# Patient Record
Sex: Female | Born: 1955 | Race: White | Hispanic: No | Marital: Married
Health system: Midwestern US, Academic
[De-identification: ages and names within clinical notes are randomized; demographics above are authoritative.]

---

## 2017-08-08 IMAGING — MG MAMMOGRAM 3D SCREEN, BILATERAL
14 of 16 series · 14 of 16 positions shown · non-contrast
Comparison: none

[R CC (1 of 2)]
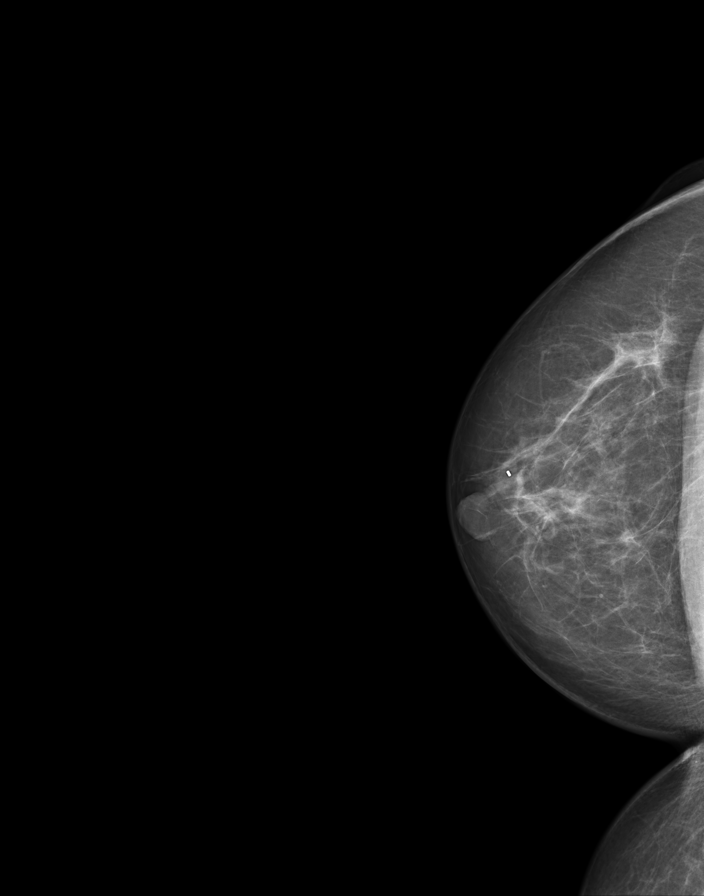

[R tomo (1 of 2)]
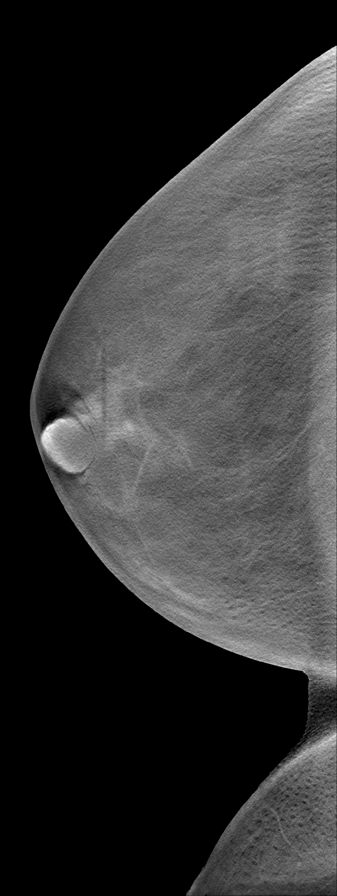

[R CC (2 of 2)]
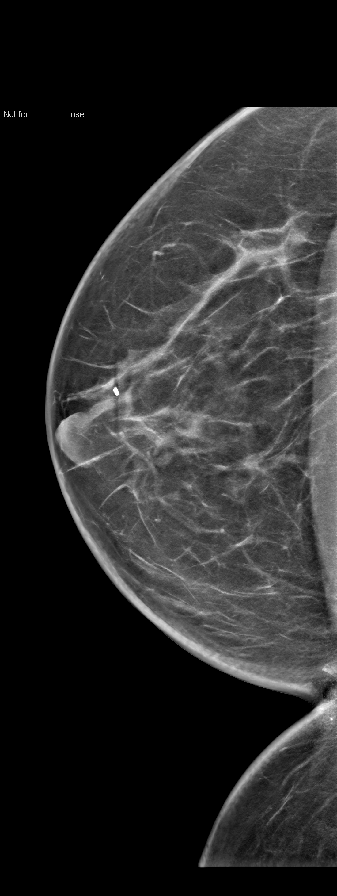

[R (1 of 2)]
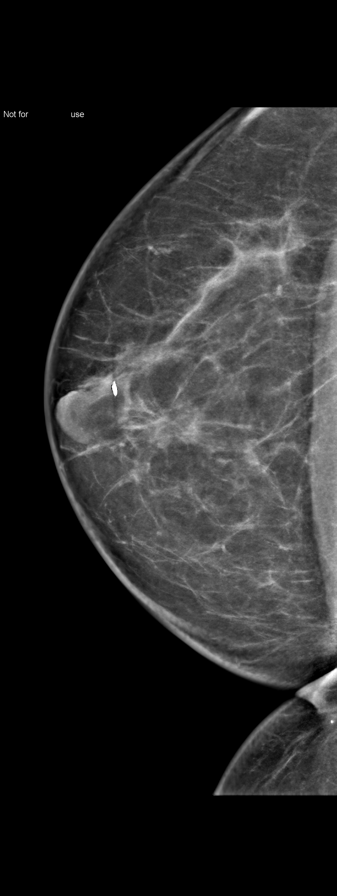

[L CC (1 of 2)]
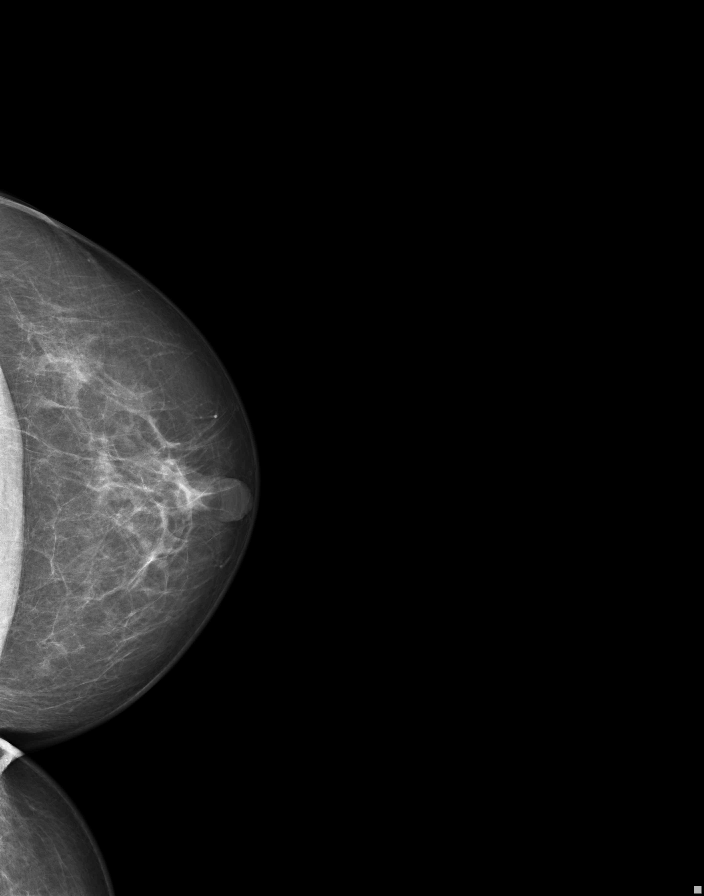

[L tomo (1 of 2)]
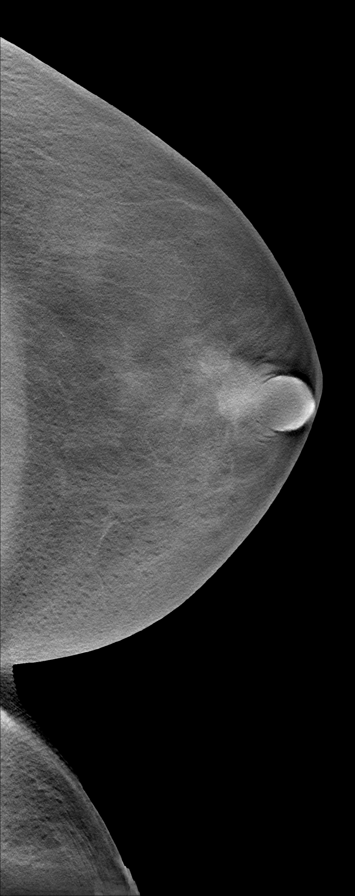

[L CC (2 of 2)]
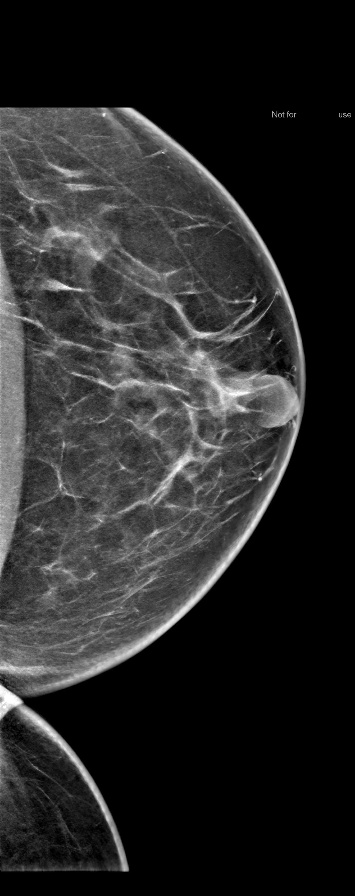

[L]
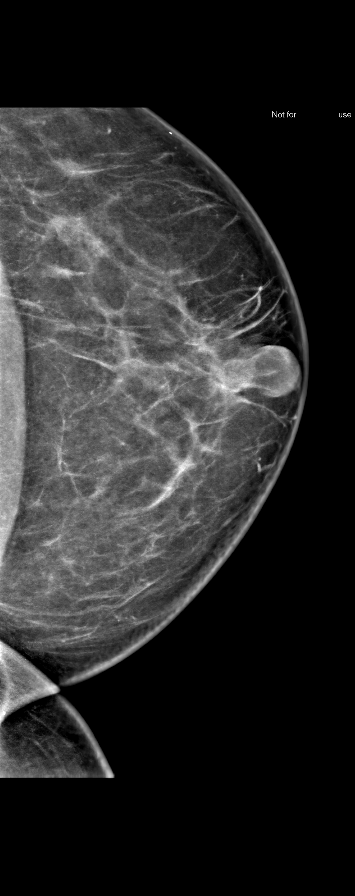

[R MLO (1 of 2)]
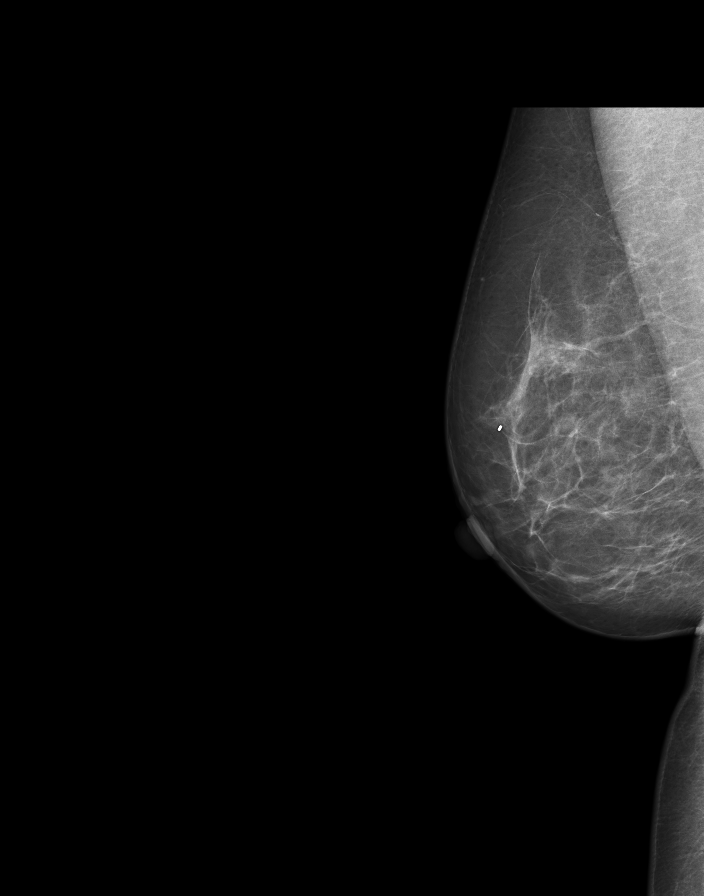

[R tomo (2 of 2)]
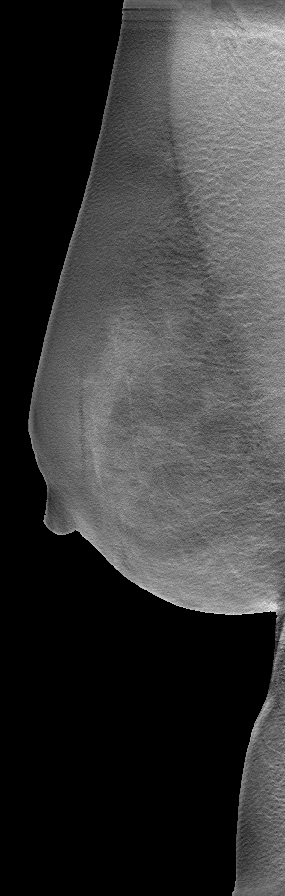

[R MLO (2 of 2)]
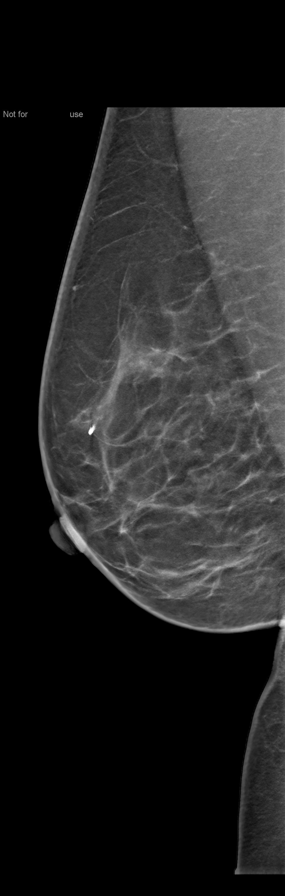

[R (2 of 2)]
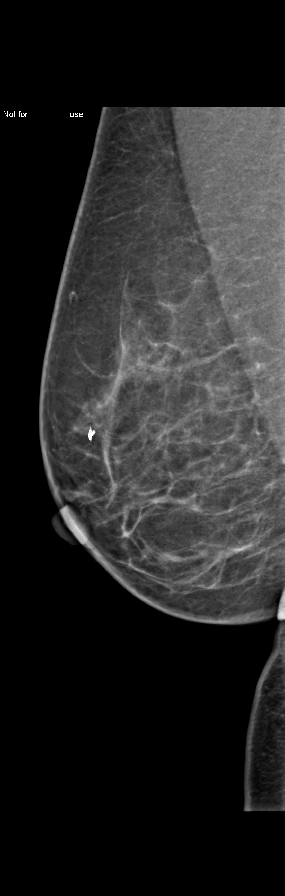

[L MLO]
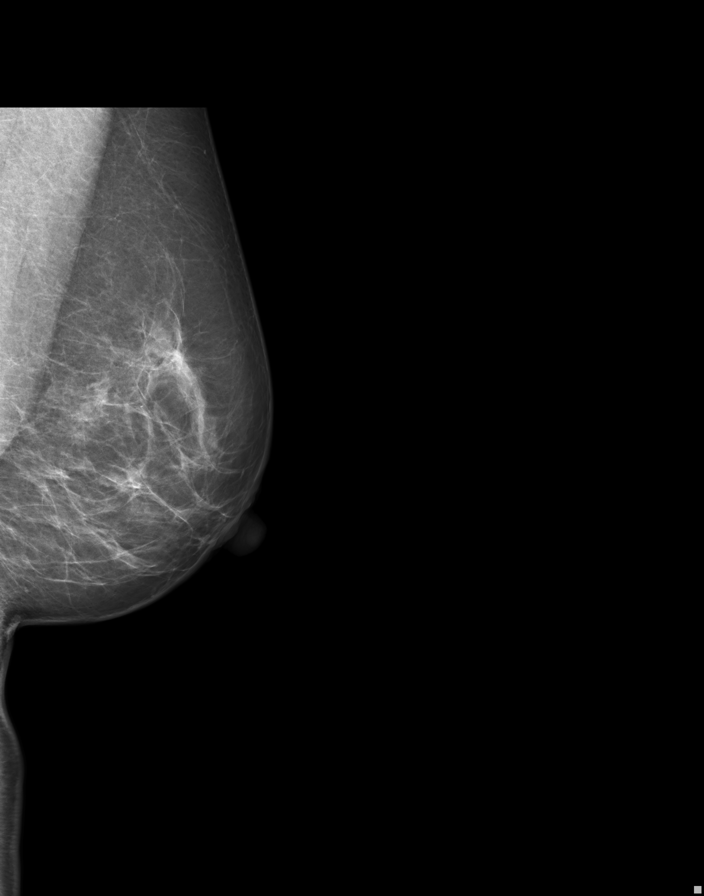

[L tomo (2 of 2)]
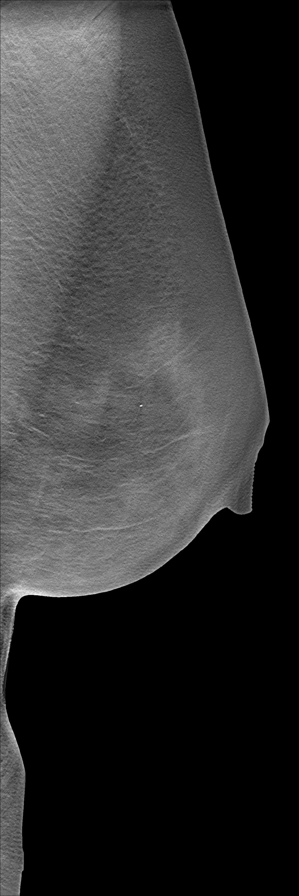

[14 of 16 positions shown; findings below may reference images not displayed]

EXAM

Digital screening mammogram with 3D breast tomography.

INDICATION

yearly screening
HRT SINCE 6453. BENIGN SURGICAL BX - RT AXILLA, 2414. SCREENING. AB
(3D) PRIORS: 2567, 4052.

FINDINGS

The prior study was reviewed from 03/21/2016.

Digital 2D CC and MLO projections obtained with 3D tomographic views per manufacturer's protocol.
ICAD version 7.2 was used during this exam.

There are scattered areas of fibroglandular density bilaterally. There are no mass lesions or
suspicious calcifications.

There is a marker clip from a previous breast biopsy on the right.

There has been no change from the previous examination.

IMPRESSION

Benign mammogram with 3D breast tomography. BI-RADS 2. Screening mammography in 12 months is
recommended. A followup letter will be scheduled.

## 2019-02-07 IMAGING — CR UP_EXM
4 series · 4 of 4 positions shown · non-contrast
Comparison: none

[elbow ap]
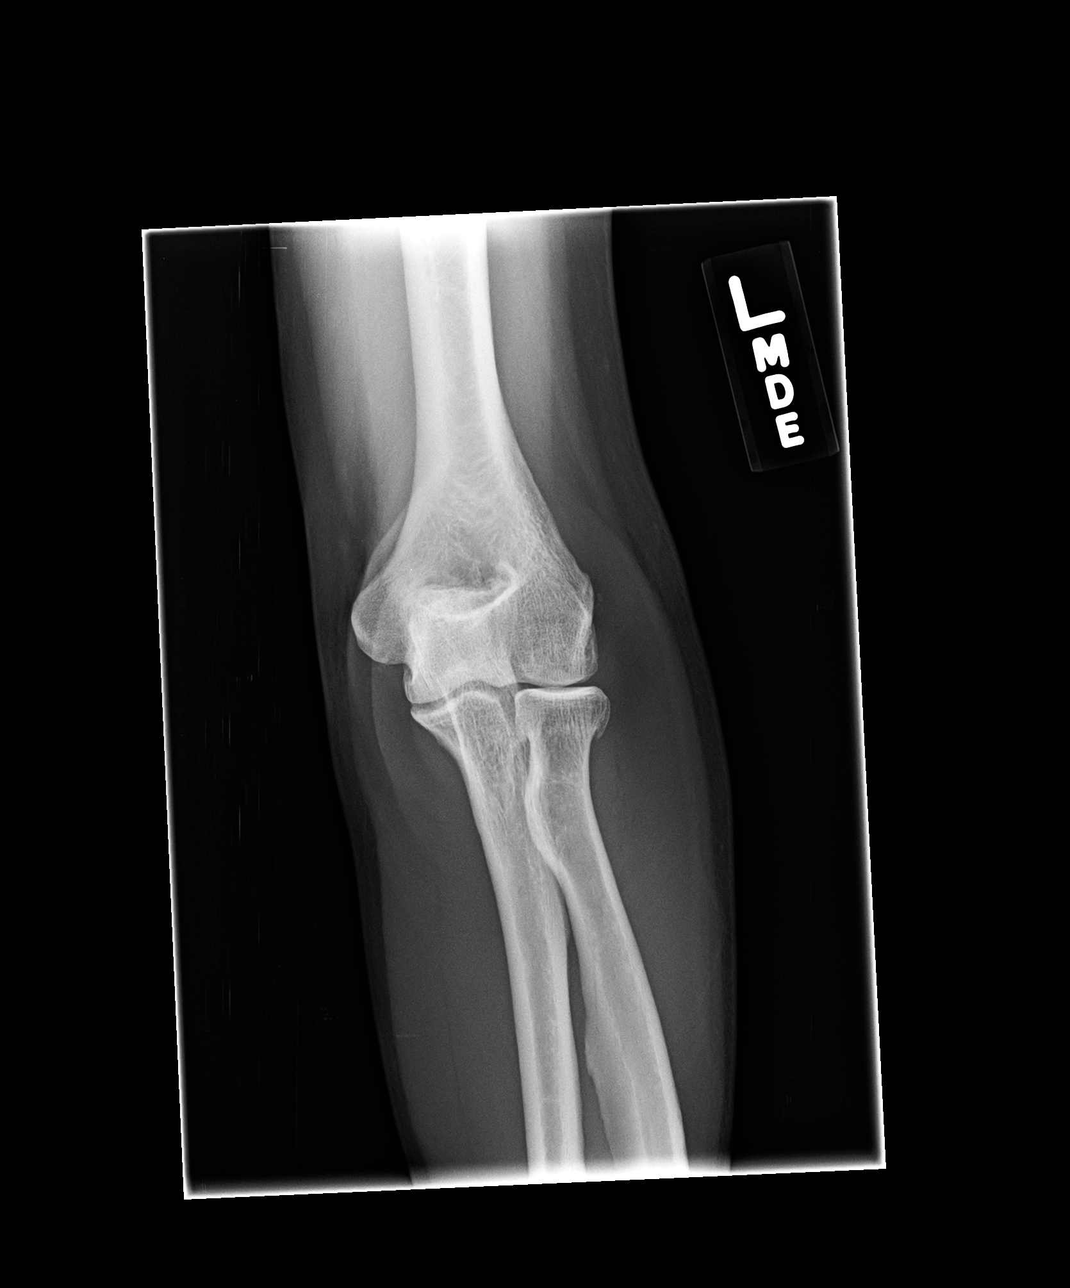

[elbow obl (1 of 2)]
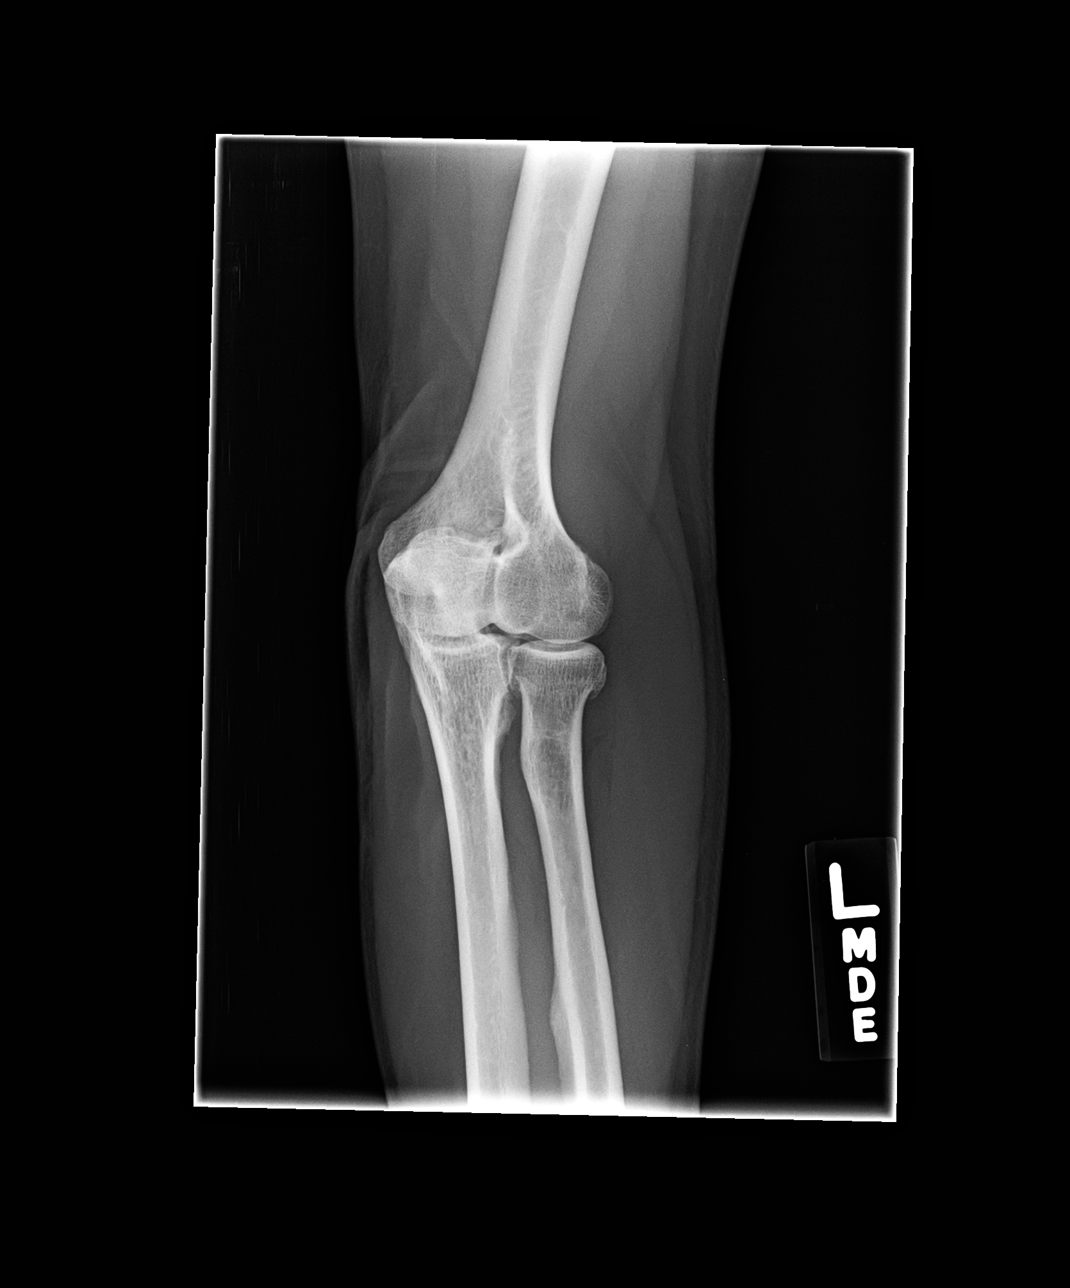

[elbow obl (2 of 2)]
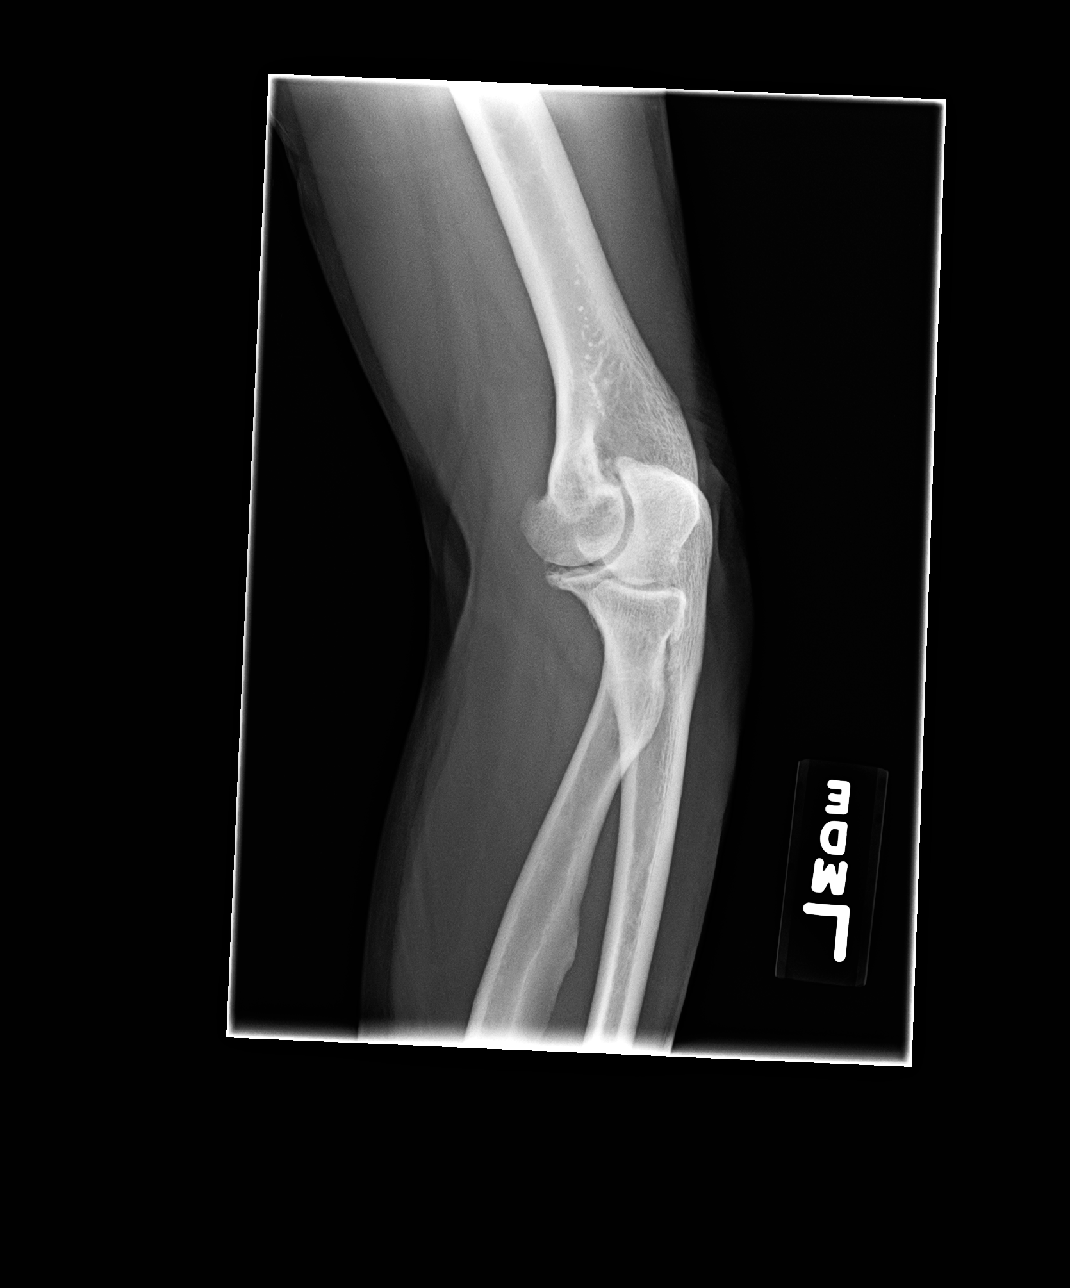

[elbow lat]
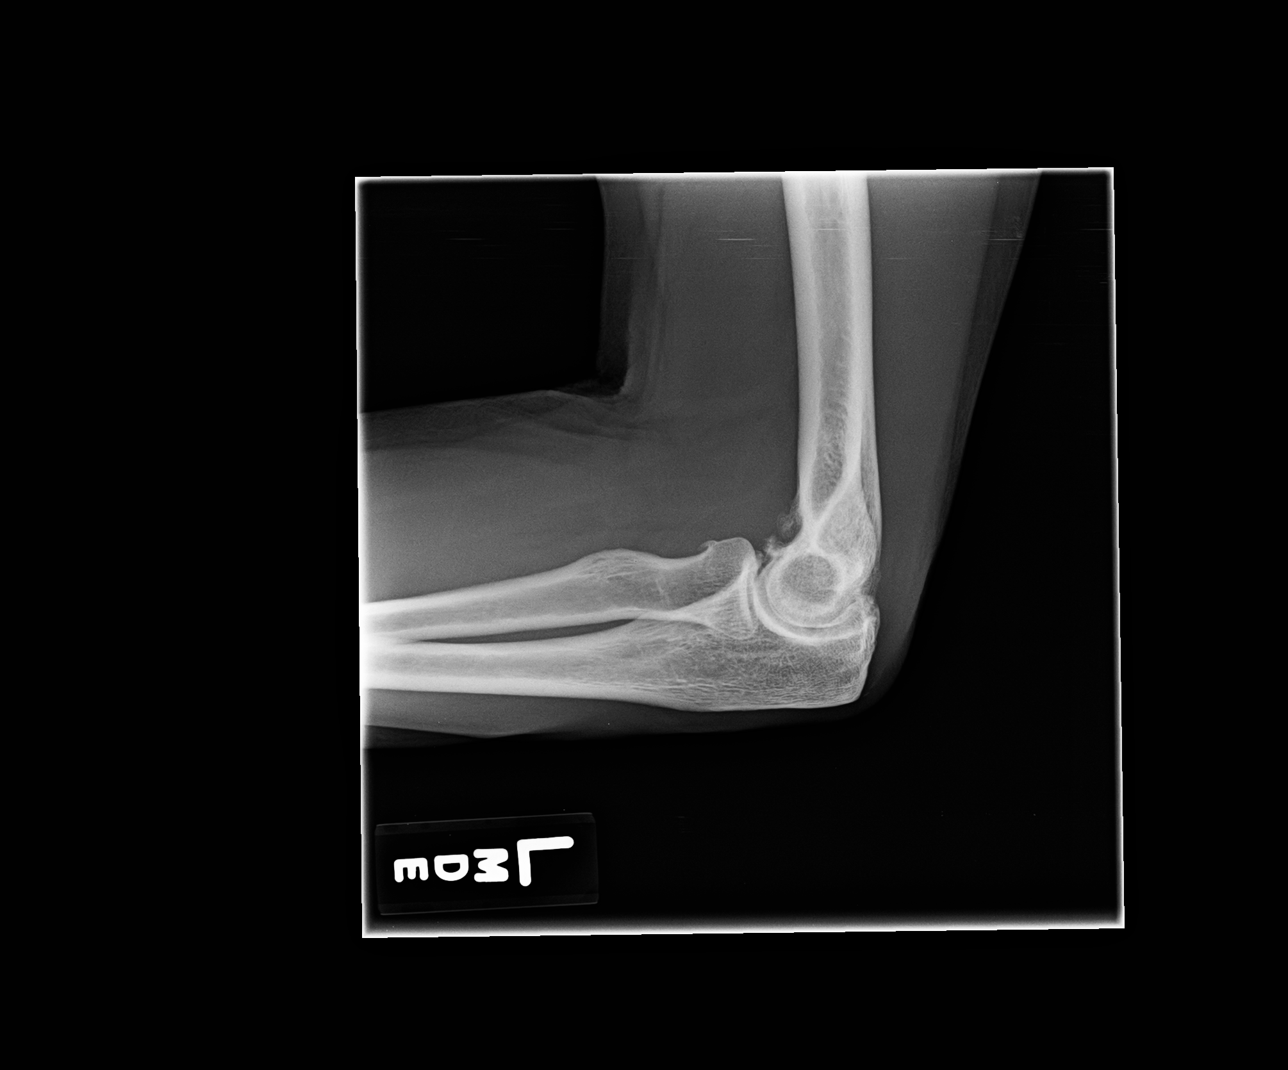

[4 of 4 positions shown; findings below may reference images not displayed]

EXAM

3 views left elbow

INDICATION

elbow pain
chronic left elbow pain in the posterior aspect. no known injury. me

TECHNIQUE

AP, lateral, and oblique views were obtained

COMPARISONS

None available.

FINDINGS

There is moderate degenerative changes of the ulnotrochlear and radiocapitellar joint. There is no
displaced fracture or dislocation. The soft tissues are unremarkable.

IMPRESSION

1. Moderate degenerative changes without acute osseus abnormality.

Tech Notes:

chronic left elbow pain in the posterior aspect. no known injury. me

## 2019-04-18 ENCOUNTER — Ambulatory Visit: Admit: 2019-04-18 | Discharge: 2019-04-18 | Payer: BC Managed Care – PPO

## 2019-04-18 ENCOUNTER — Encounter: Admit: 2019-04-18 | Discharge: 2019-04-18 | Payer: BC Managed Care – PPO

## 2019-04-18 DIAGNOSIS — K1120 Sialoadenitis, unspecified: Secondary | ICD-10-CM

## 2019-04-18 DIAGNOSIS — Z780 Asymptomatic menopausal state: Secondary | ICD-10-CM

## 2019-04-18 DIAGNOSIS — E7800 Pure hypercholesterolemia, unspecified: Secondary | ICD-10-CM

## 2019-04-18 DIAGNOSIS — J302 Other seasonal allergic rhinitis: Secondary | ICD-10-CM

## 2019-04-18 DIAGNOSIS — H903 Sensorineural hearing loss, bilateral: Secondary | ICD-10-CM

## 2019-04-18 DIAGNOSIS — H9201 Otalgia, right ear: Secondary | ICD-10-CM

## 2019-04-18 DIAGNOSIS — E039 Hypothyroidism, unspecified: Secondary | ICD-10-CM

## 2019-04-18 NOTE — Progress Notes
Date of Service: 04/18/2019    Subjective:             Anna Mitchell is a 63 y.o. female.    History of Present Illness  Anna Mitchell is referred by Dr. Orson Gear for a complaint of possible salivary gland issues.      Anna Mitchell reports a couple of years ago (2) she had a weird sensation in her right eustachian tube or ear.  The sensation resolved for a time, but now intermittently returns. She denies hearing her own voice in her right ear. She denies changes in hearing, or bouts of spinning dizziness. Her husband has made comments about her hearing being worse and she is interested in further testing.     Anna Mitchell also states in September she had 3 weeks of either to much saliva in her mouth, to the point of drooling, or extremely dry mouth and right tongue ulcerations. Her right tongue has had blisters and an aching pain. She reports her mouth and tongue were very painful but is improving. She denies swelling of her right parotid or submandibular glands with eating or thinking about eating. She did have pain with eating due to the ulcerations on her tongue. Her dental hygienist noted her left tongue was puff and red. History of teeth grinding and wears an oral appliance for this problem.       Medical History:   Diagnosis Date   ? High cholesterol    ? Hypothyroid    ? Menopause    ? Seasonal allergic reaction      Surgical History:   Procedure Laterality Date   ? TUBAL LIGATION  1986   ? KNEE SURGERY  2012    right knee   ? HX TONSILLECTOMY       Family History   Problem Relation Age of Onset   ? Hypertension Brother    ? Stroke Maternal Grandfather      Social History     Tobacco Use   Smoking Status Never Smoker   Smokeless Tobacco Never Used     Social History     Substance and Sexual Activity   Drug Use No       PMH, SH, FH, allergies and medications above have been reviewed.       Review of Systems   Constitutional: Negative.    HENT: Positive for drooling, ear pain, mouth sores and sore throat. Eyes: Negative.    Respiratory: Negative.    Cardiovascular: Negative.    Gastrointestinal: Negative.    Endocrine: Negative.    Genitourinary: Negative.    Musculoskeletal: Negative.    Skin: Negative.    Allergic/Immunologic: Negative.    Neurological: Negative.    Hematological: Negative.    Psychiatric/Behavioral: Negative.          Objective:         ? cetirizine HCl (ZYRTEC PO) Take  by mouth.   ? MAGNESIUM PO Take  by mouth.   ? other medication 1 Dose. Progesterone 1 tab po qhs   ? PNV 12-IRON-METHYLFOLATE-DHA PO Take  by mouth.   ? testosterone-estriol-DHEA(+) 11-01-08 mg vaginal mini-suppository Insert or Apply 1 Suppository to vaginal area as directed. Insert one suppository into vagina daily for 5 days, then three times weekly   ? thyroid pork (ARMOUR THYROID) 60 mg (1 gr) tablet Daily     Vitals:    04/18/19 1031   BP: 116/73   Pulse: 65   Temp: 36.9 ?C (98.4 ?F)  TempSrc: Oral   Weight: 62.3 kg (137 lb 6.4 oz)   Height: 163.8 cm (64.5)     Body mass index is 23.22 kg/m?Marland Kitchen     Physical Exam  General: Well-developed, well-nourished   Communication and Voice: Clear pitch and clarity   Hearing: Hearing adequate for verbal communication bilaterally   Inspection: Normocephalic and atraumatic without mass or lesion   Palpation: Facial skeleton intact without bony stepoffs.    Facial Strength: Facial motility symmetric and full bilaterally   Pinna: External ear intact and fully developed   External canal: Canal is patent with intact skin   Tympanic Membrane: Normal bilaterally   External nose: No scar or anatomic deformity   Internal Nose: Septum intact.  MMM.  Turbinates 2+  Oral cavity, Lips, Teeth, and Gums: Mucosa and teeth intact and viable, No lesions, masses or ulcers. Clear saliva at Stenson's duct with parotid massage.    Oropharynx: No erythema or exudate, no masses or ulcerations, no asymmetry   Larynx: Normal voice, no stridor or stertor. Neck, Trachea, Lymphatics: Midline trachea without mass or lesion, no lymphadenopathy.  No TMJ ttp.   Thyroid: No mass or nodularity   Eyes: No nystagmus with equal extraocular motion bilaterally   Neuro/Psych/Balance: Patient oriented and appropriate in interaction; Appropriate mood and affect; Gait is intact with no imbalance; Cranial nerves I-XII are intact   Respiratory effort: Equal inspiration and expiration, no respiratory distress   Peripheral Vascular: Warm extremities with equal distal pulses    Audiogram reviewed and discussed with patient. 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 Excellent WRS Au. Right tymp hyper compliant. Left tymp WNL.     Differential diagnosis:  Nervus Intermedius Neuralgia, Deep lobe parotid tumor, herpes zoster, TMJ, Cholesteatoma, otitis externa, otitis media, bullous myringitis, pharyngeal tumor, EAC foreign body/trauma, dental       Assessment and Plan:  ASSESSMENT:  1. Sialadenitis  CT NECK WO CONTRAST    CANCELED: CT NECK WO CONTRAST   2. Right ear pain  CT NECK WO CONTRAST         PLAN:  An order for CT neck without contrast (looking for deep lobe parotid/skull base mass vs sialadenitis/sialolithiasis) was placed today. 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 She will call to let us know when the scan has been completed so we can request the images for personal review and report back to her.    Thanks for allowing me to participate in the care of this pleasant patient.                 In the presence of Lurline Idol, MD,  I have taken down these notes, Astrid Divine, Scribe. 04/18/2019 11:13 AM

## 2019-05-02 ENCOUNTER — Encounter: Admit: 2019-05-02 | Discharge: 2019-05-02 | Payer: BC Managed Care – PPO

## 2019-05-02 ENCOUNTER — Ambulatory Visit: Admit: 2019-05-02 | Discharge: 2019-05-02 | Payer: BC Managed Care – PPO

## 2019-05-02 DIAGNOSIS — H9201 Otalgia, right ear: Secondary | ICD-10-CM

## 2019-05-02 DIAGNOSIS — K1120 Sialoadenitis, unspecified: Secondary | ICD-10-CM

## 2019-05-06 ENCOUNTER — Encounter: Admit: 2019-05-06 | Discharge: 2019-05-06 | Payer: BC Managed Care – PPO

## 2019-05-07 ENCOUNTER — Encounter: Admit: 2019-05-07 | Discharge: 2019-05-07 | Payer: BC Managed Care – PPO

## 2019-05-07 NOTE — Telephone Encounter
Calling for CT Scan results

## 2020-12-03 IMAGING — MR L-spine^Routine
4 series · 37 of 48 positions shown · non-contrast
Comparison: none

[Series 2: T2 · sagittal · 4.0mm · 0.62mm/px · 9 of 13 slices shown (1 of 2)]
[im 1/13]
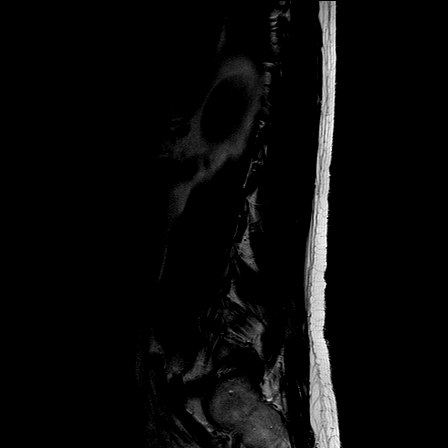
[im 3/13]
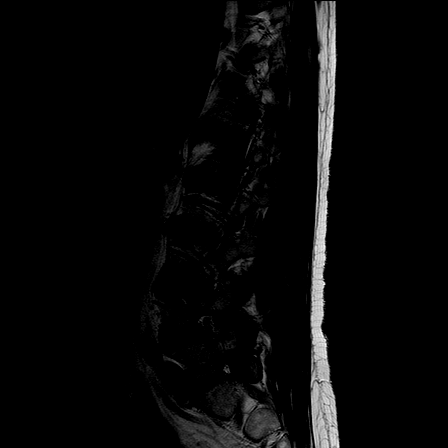
[im 4/13]
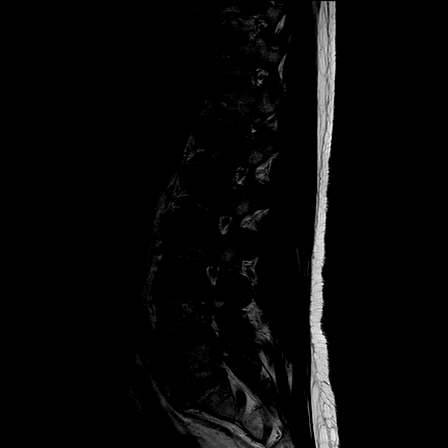
[im 6/13]
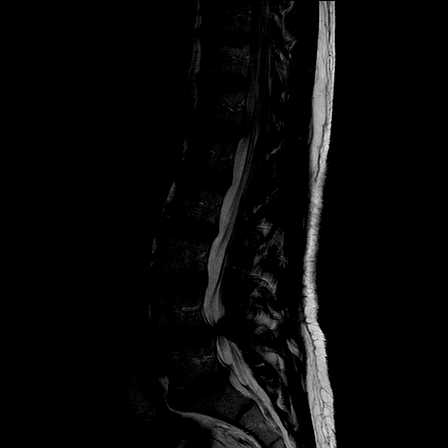
[im 7/13]
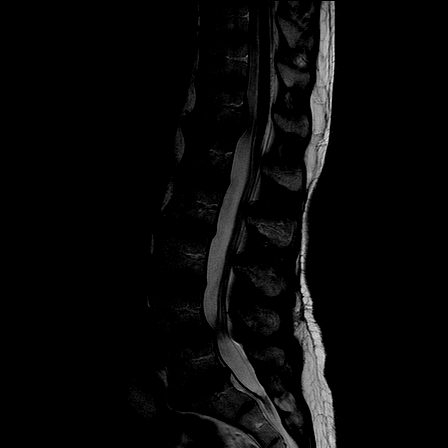
[im 9/13]
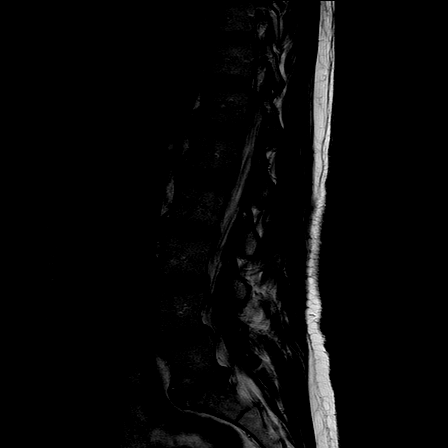
[im 10/13]
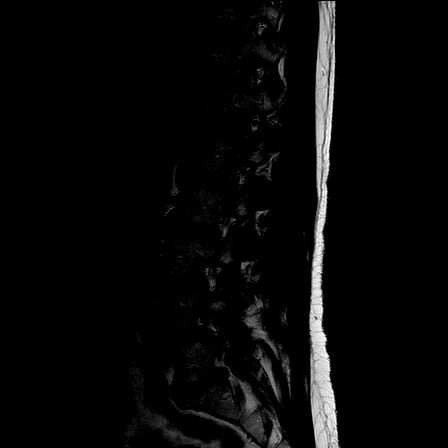
[im 11/13]
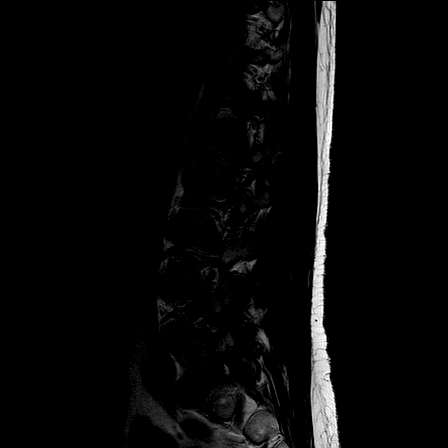
[im 13/13]
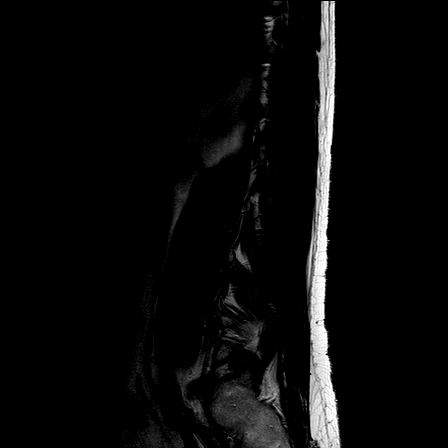

[Series 3: T1 · sagittal · 4.0mm · 0.73mm/px · 8 of 10 slices shown (1 of 2)]
[im 1/10]
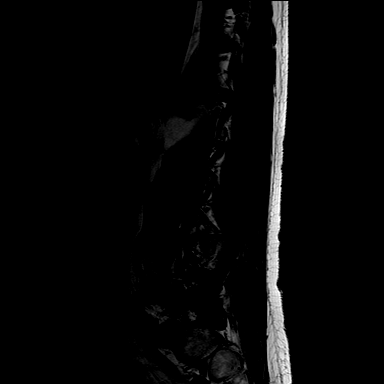
[im 2/10]
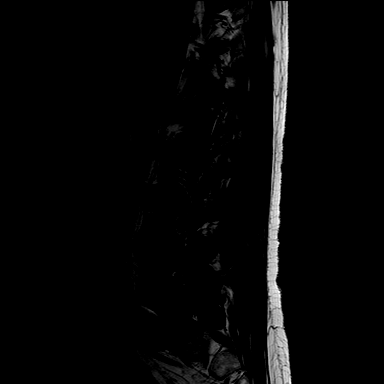
[im 4/10]
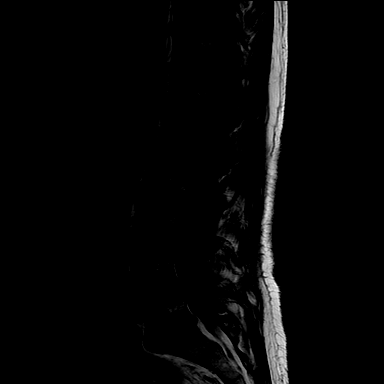
[im 5/10]
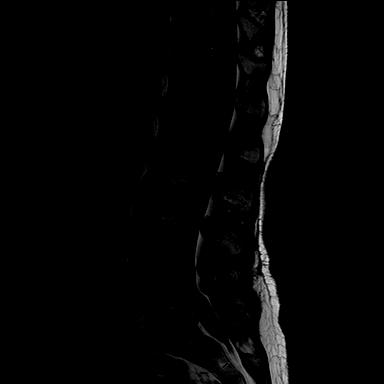
[im 6/10]
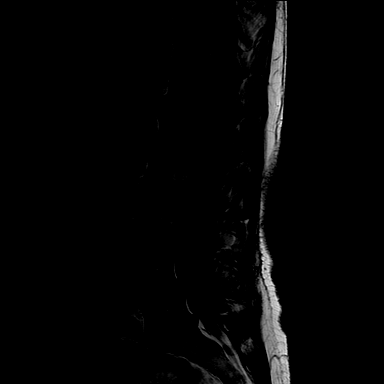
[im 7/10]
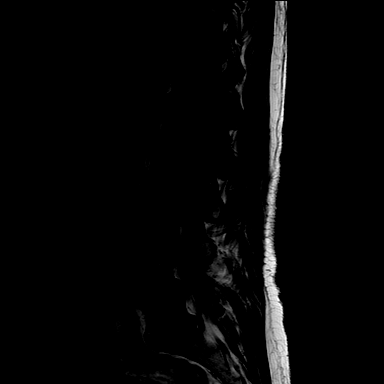
[im 9/10]
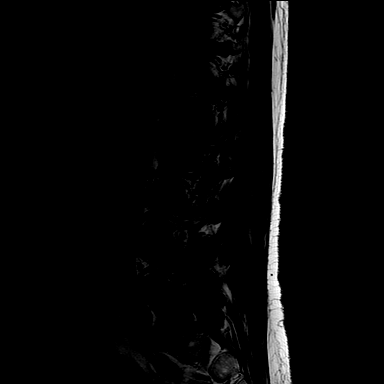
[im 10/10]
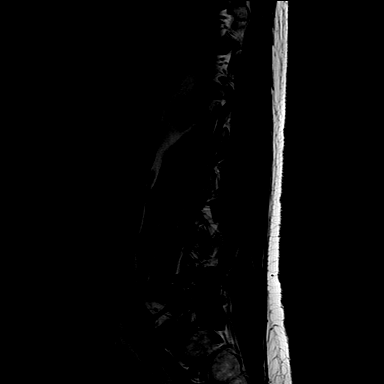

[Series 5: T2 · axial · 4.5mm · 0.49mm/px · z∈[-144,+21]mm · 9 of 15 slices shown (2 of 2)]
[im 1/15]
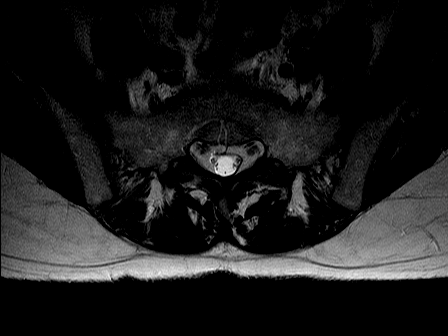
[im 3/15]
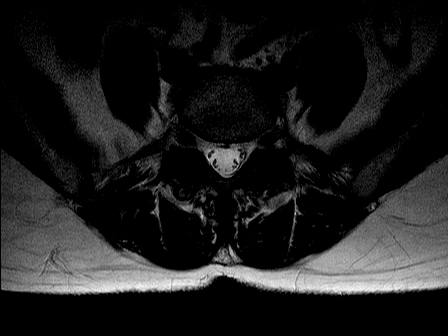
[im 5/15]
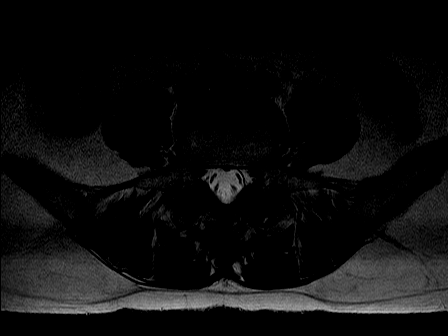
[im 7/15]
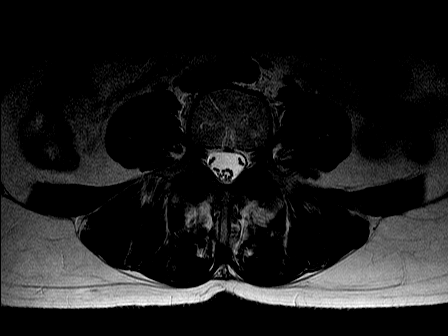
[im 8/15]
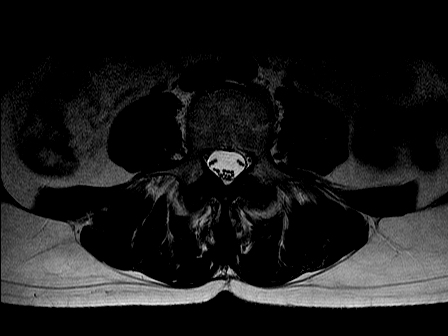
[im 9/15]
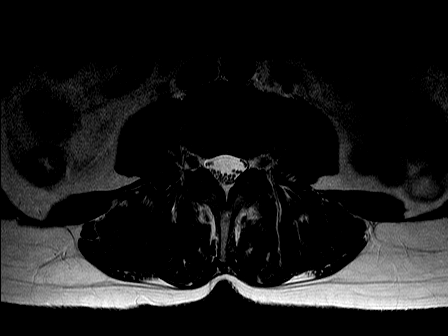
[im 11/15]
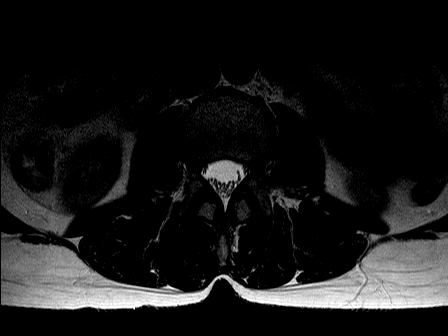
[im 13/15]
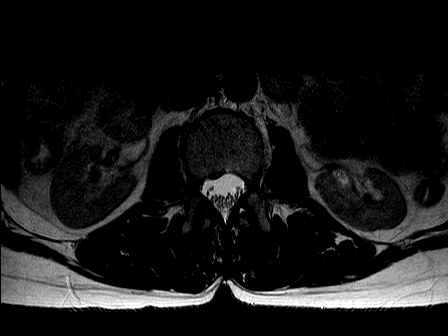
[im 15/15]
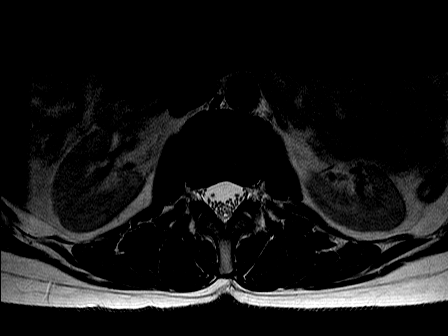

[Series 6: T1 · axial · 4.5mm · 0.86mm/px · z∈[-129,+38]mm · 11 of 11 slices shown (2 of 2)]
[im 1/11]
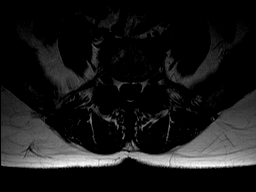
[im 2/11]
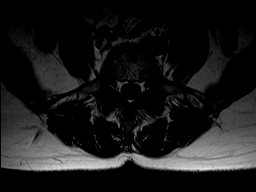
[im 3/11]
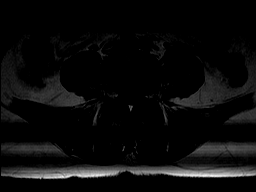
[im 4/11]
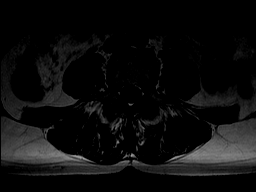
[im 5/11]
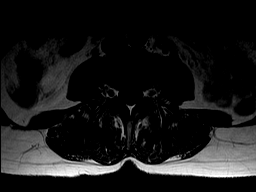
[im 6/11]
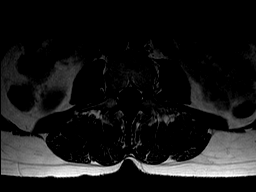
[im 7/11]
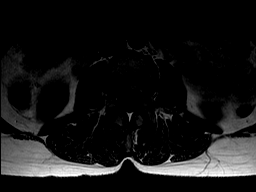
[im 8/11]
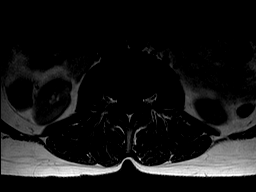
[im 9/11]
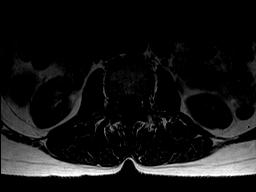
[im 10/11]
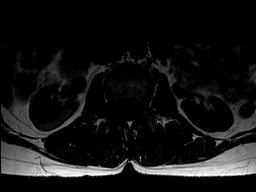
[im 11/11]
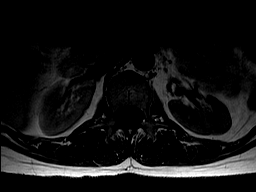

[37 of 48 positions shown; findings below may reference images not displayed]

DIAGNOSTIC STUDIES

EXAM

MRI lumbar spine without contrast

INDICATION

radicular low back pain
LOW BACK PAIN WITH RADIATION DOWN LEFT LEG, WORSE WITH SITTING.  RG

TECHNIQUE

Sagittal, and axial images were obtained with variable T1 and T2 weighting.

COMPARISONS

None available

FINDINGS

There are no plain films. Patient is assumed to have 5 lumbar type vertebral bodies.

Conus medullaris is normal in signal intensity and location.

T12-L1: This is only imaged on sagittal imaging. Apparent disc extrusion is seen extending cephalad
image 7 series 2. MRI thoracic spine is recommended.

L1-2: The disc is normal at this level without central canal or neural foraminal stenosis.

L2-3: Minimal disc bulging is noted. No central canal or neural foraminal stenosis is seen.

L3-4: Disc is essentially normal at this level without central canal or neural foraminal stenosis.

L4-5: There is anterior listhesis of L4 in relation to L5 measuring 4 millimeters. Associated facet
hypertrophy is seen resulting in moderate bilateral neural foraminal stenosis. Bilateral ligament
flavum hypertrophy is evident.

L5-S1: Diffuse disc bulging is noted. In conjunction with facet hypertrophy there is moderate to
severe bilateral neural foraminal stenosis. This is slightly greater on the right than left.

IMPRESSION

Probable T12-L1 disc extrusion with cephalad extension. This is not completely imaged. MRI of the
thoracic spine is recommended.

Minimal anterior listhesis of L4 in relation to L5. In conjunction with bilateral facet hypertrophy
and ligamentum flavum hypertrophy, there is moderate bilateral neural foraminal stenosis at L4-5.

Moderate to severe bilateral neural foraminal stenosis at L5-S1 slightly greater on the right than
left.

Tech Notes:

LOW BACK PAIN WITH RADIATION DOWN LEFT LEG, WORSE WITH SITTING.  RG

## 2020-12-18 IMAGING — RF FL guided spine inject
1 series · 2 of 2 positions shown · non-contrast
Comparison: none

[Series 1: unknown protocol · 0.20mm/px · 2 of 2 slices shown]
[im 1/2]
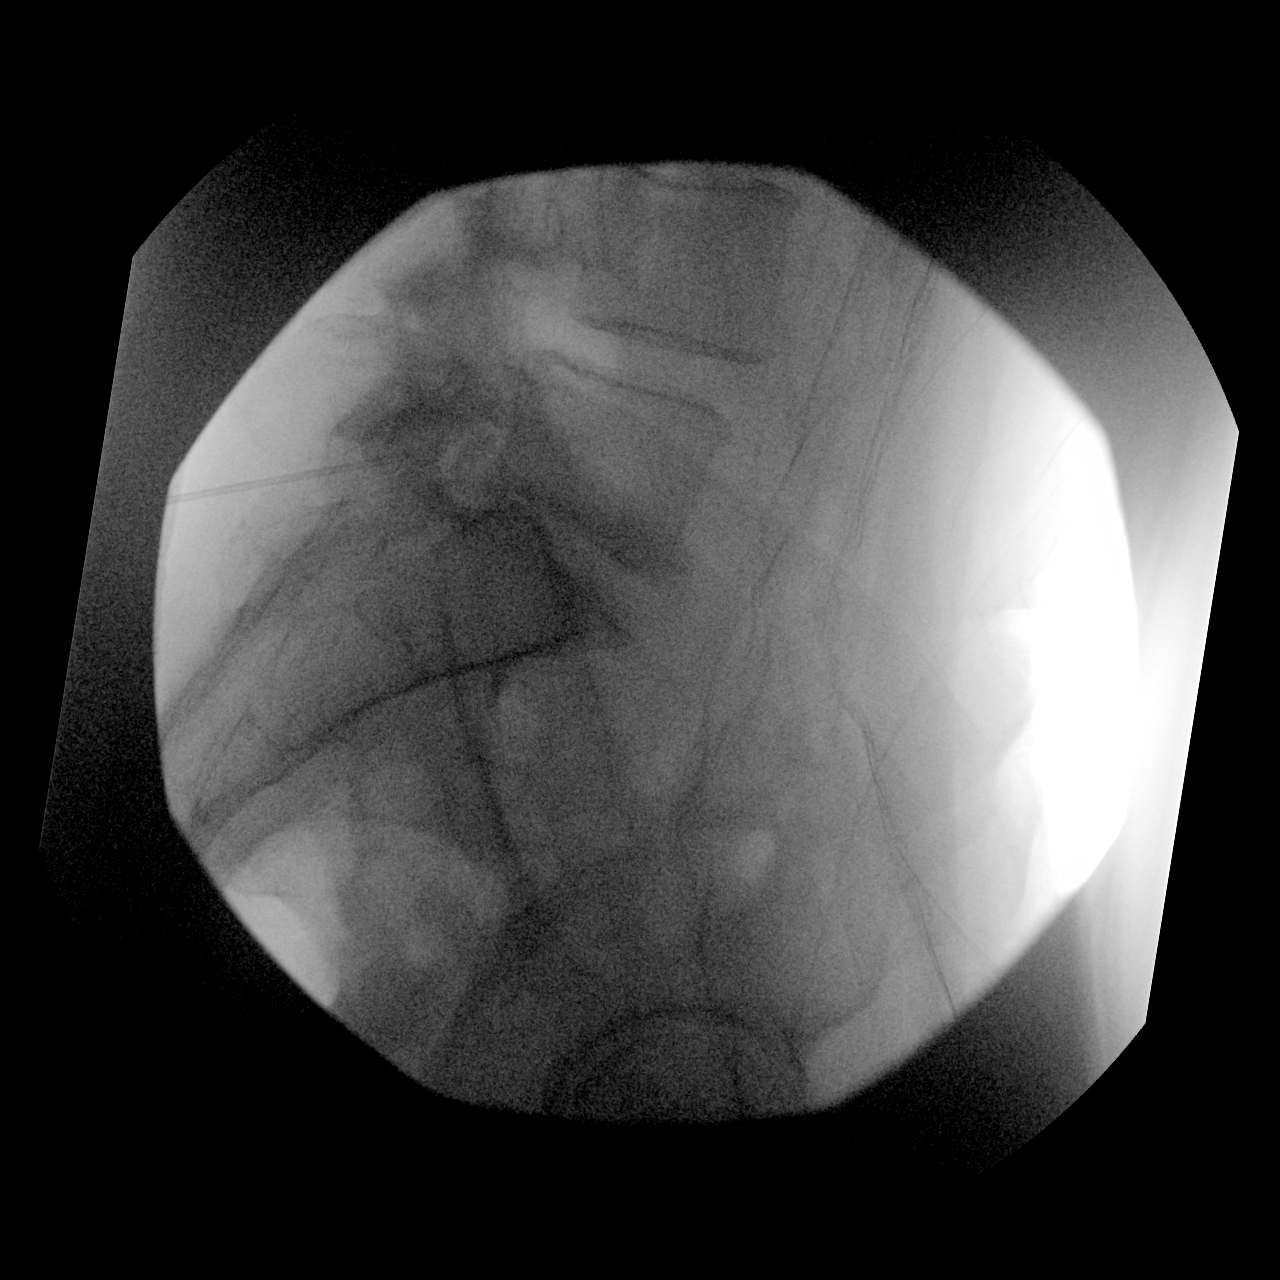
[im 2/2]
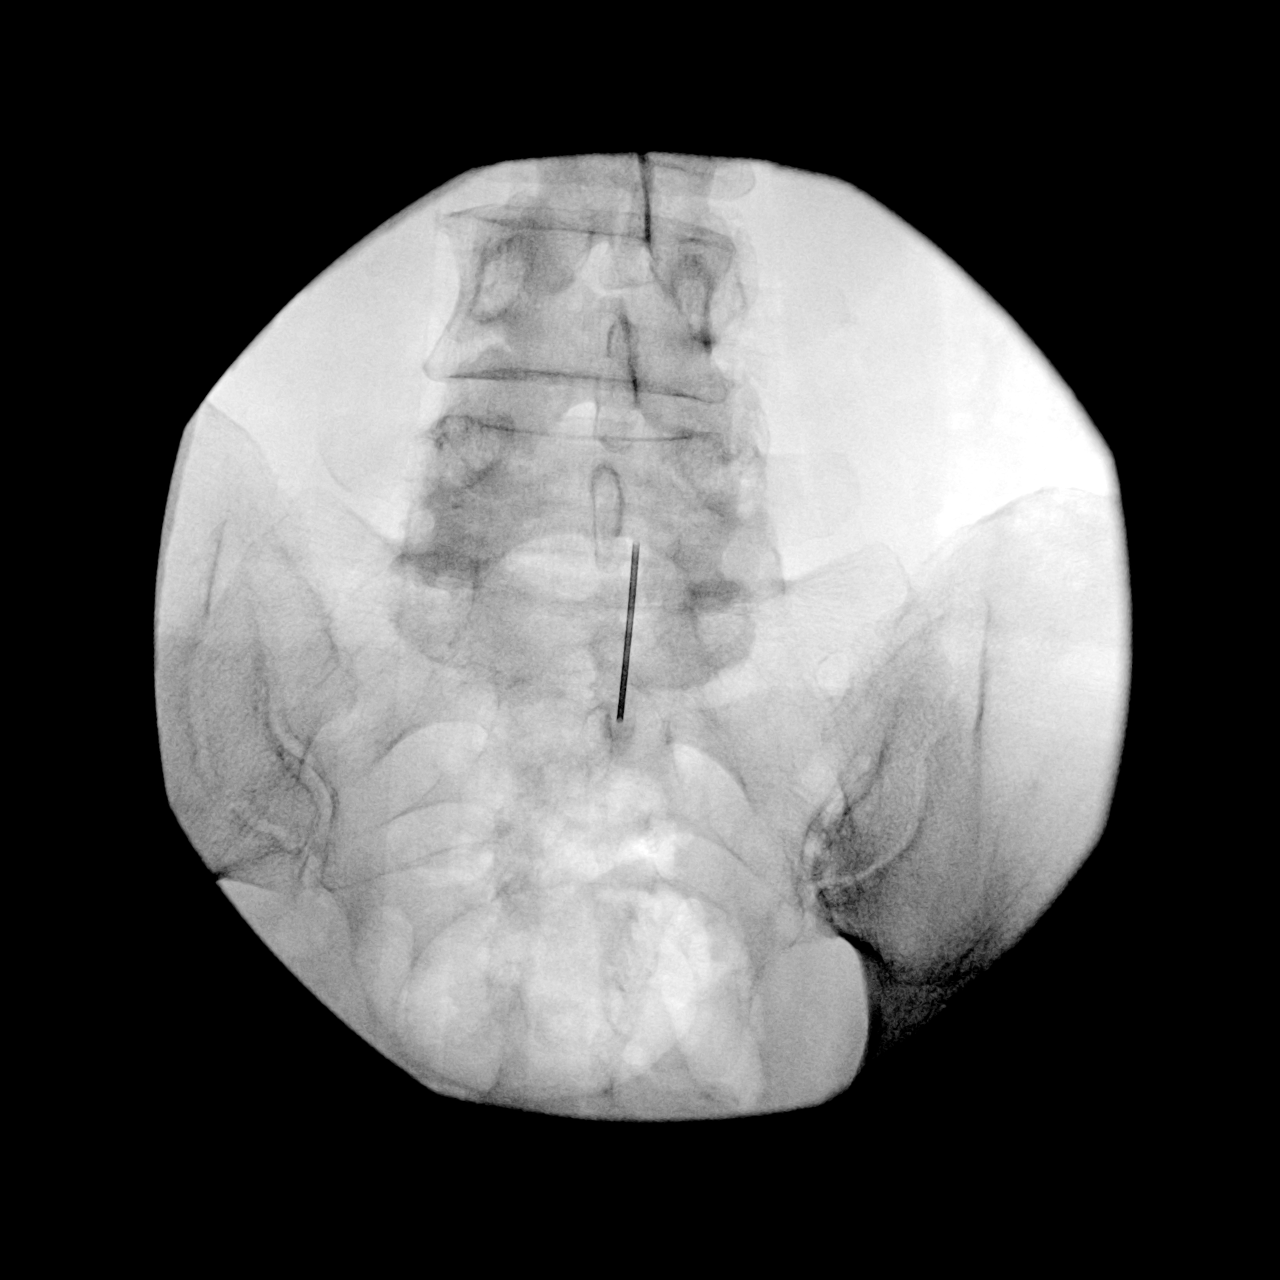

[2 of 2 positions shown; findings below may reference images not displayed]

DIAGNOSTIC STUDIES

FLUORO GUIDED INJECTION

EXAM

FLUORO GUIDED INJECTION

INDICATION

Low back pain pain managed

TECHNIQUE AND FINDINGS

FLUORO TIME: 5.6 sec
IMAGES: 2

Intraprocedural images with C-arm technique are submitted. Ra linear radiopacity compatible with
needle is present just paramidline at the level of the posterior elements of L5-S1.

IMPRESSION

Intraprocedural images as described above.

Tech Notes:

MEGHA HAMNER/BENERANDO ALATA.
HB

## 2021-01-22 IMAGING — CR HIPCMLT
3 series · 3 of 3 positions shown · non-contrast
Comparison: none

[hip ap pelvis]
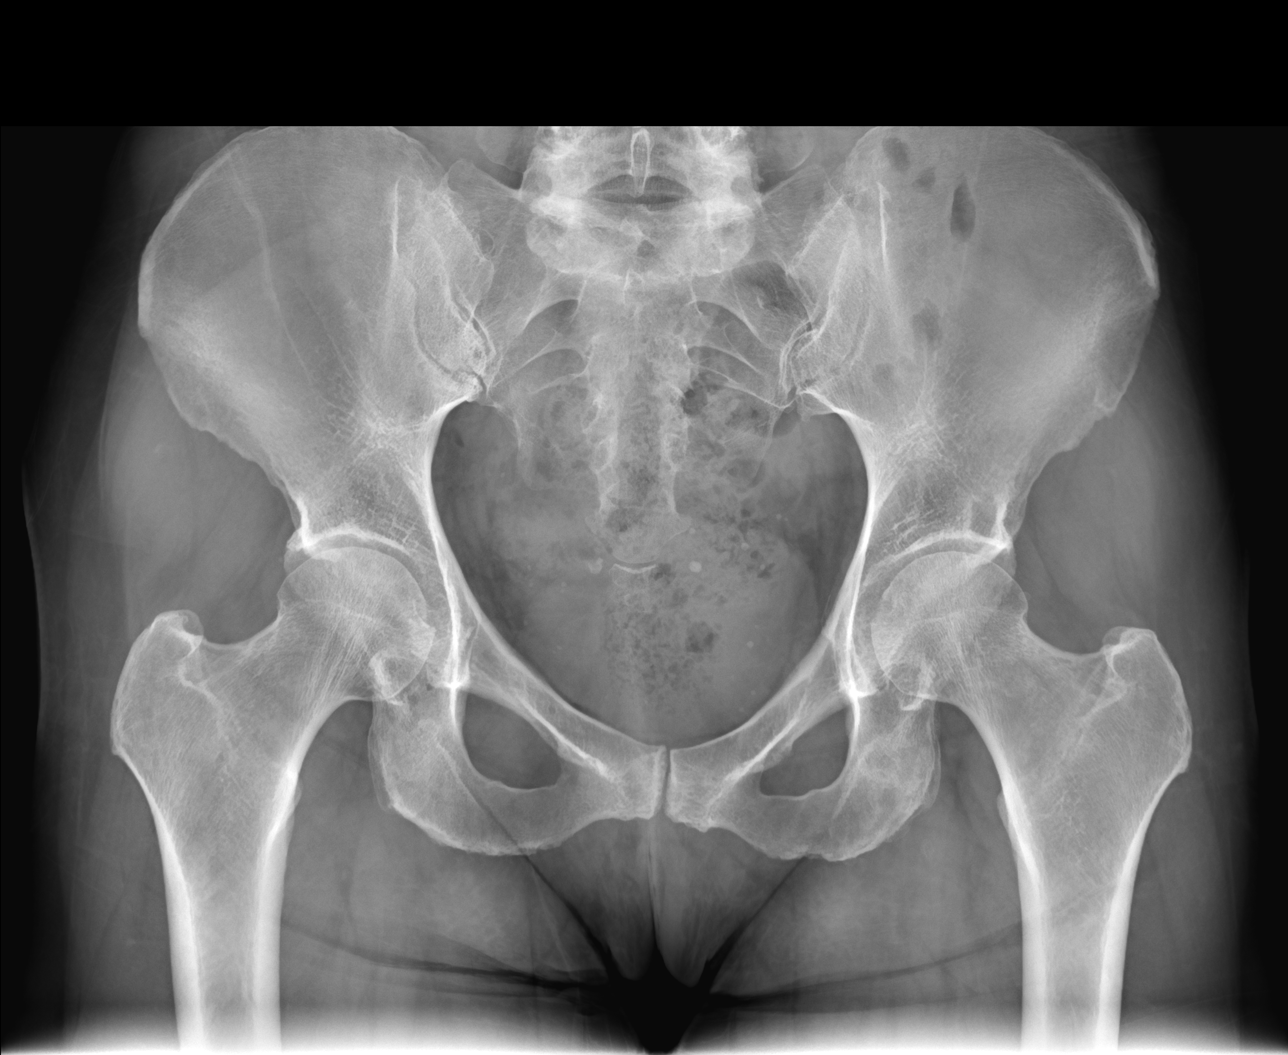

[hip ap]
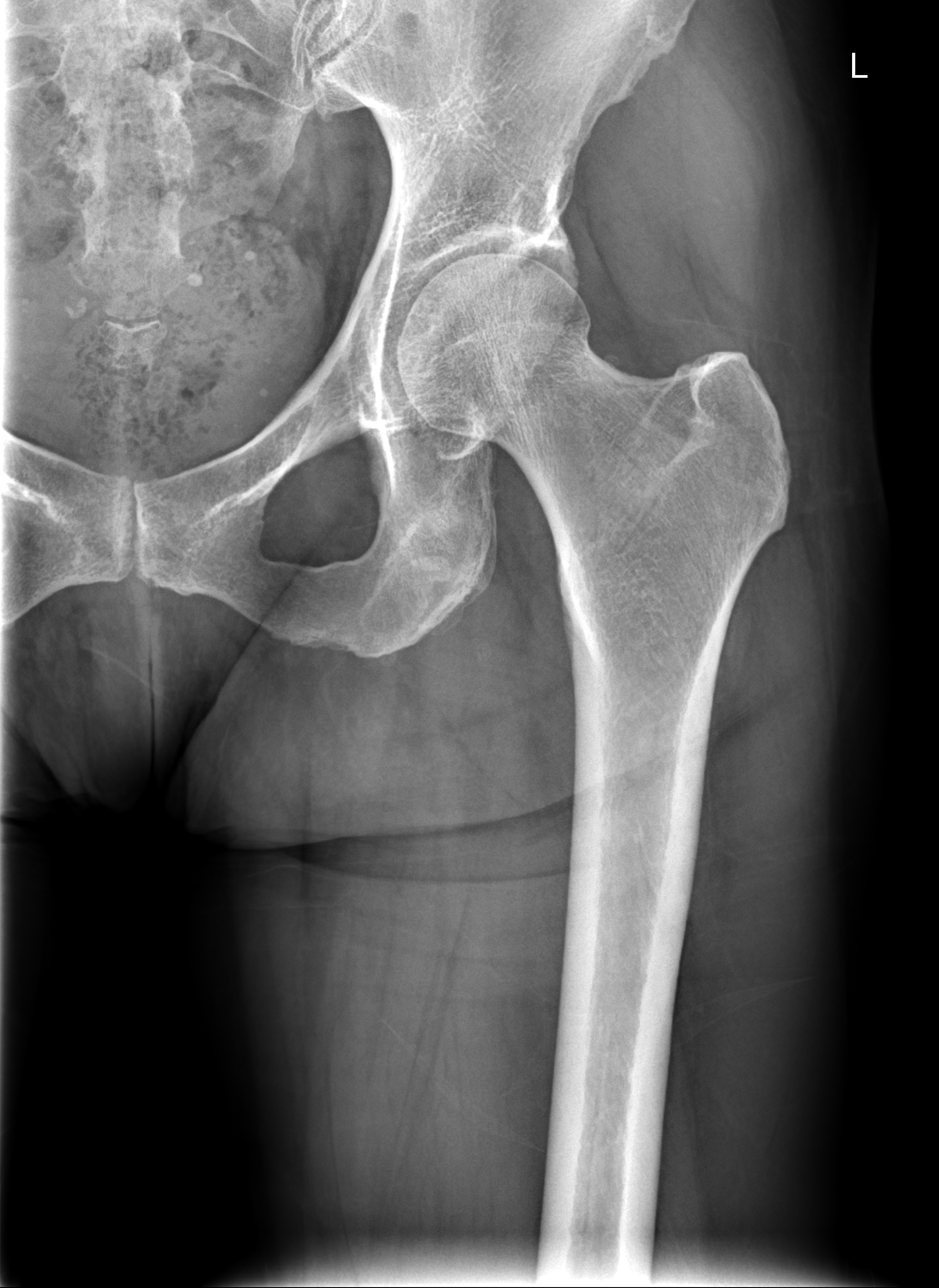

[hip frog lat]
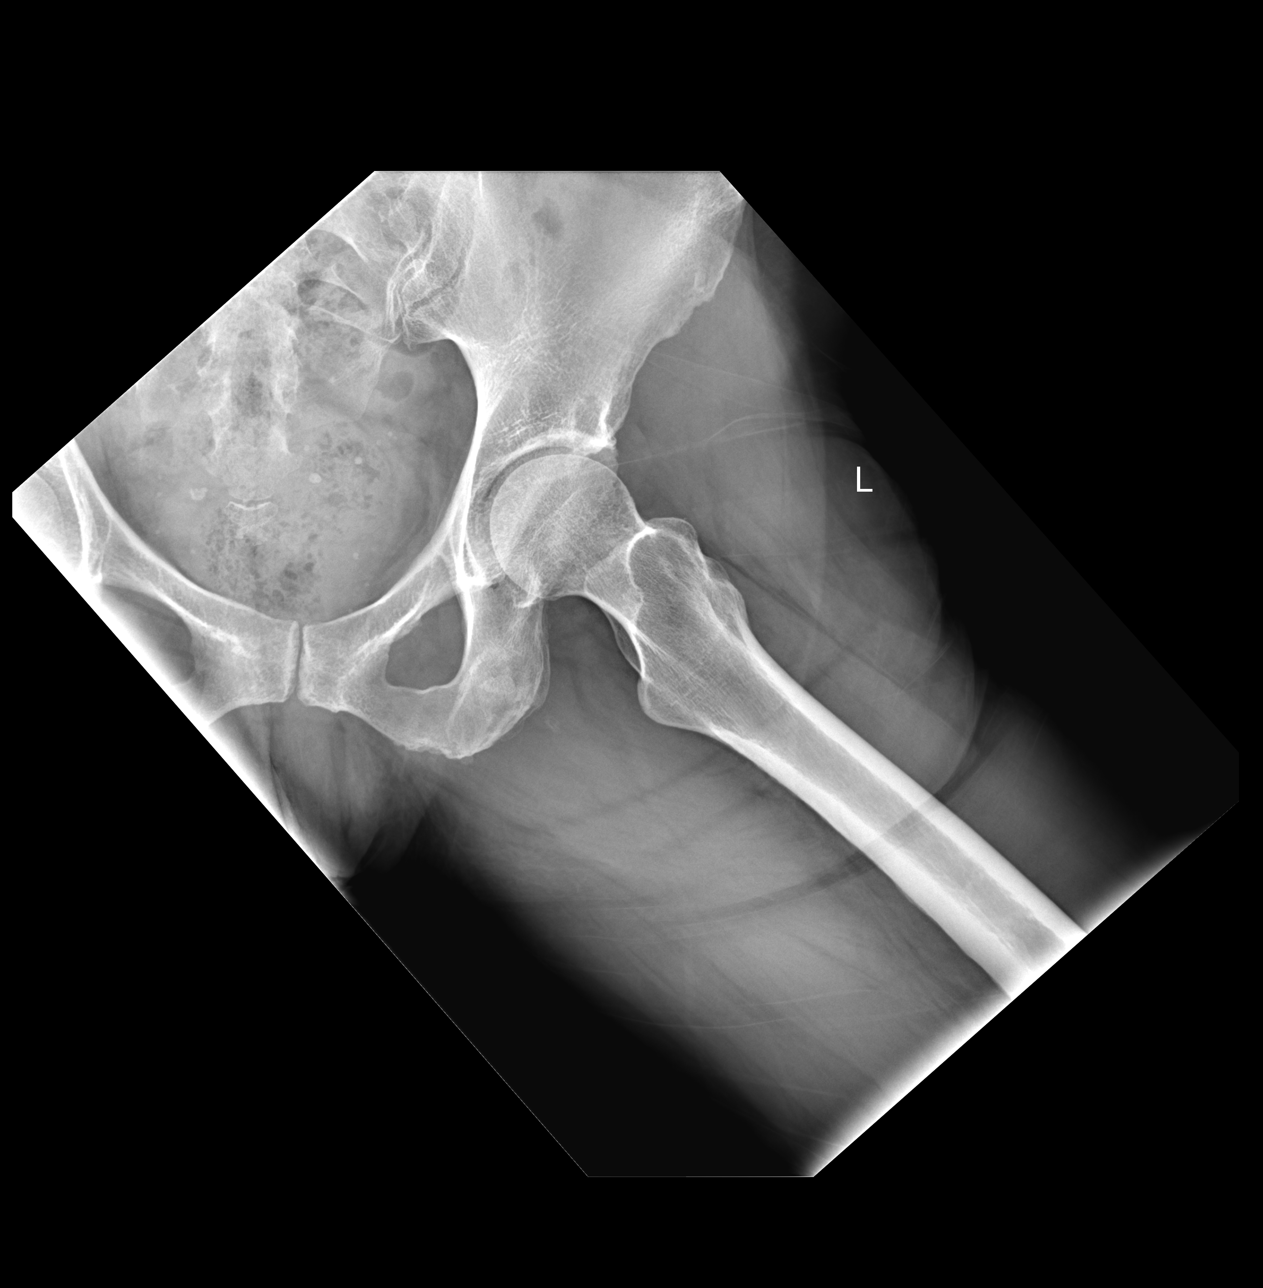

[3 of 3 positions shown; findings below may reference images not displayed]

EXAM

The left hip and pelvis

INDICATION

Left buttock pain
C/O LT HIP AND BUTTOCK PAIN X 1 YEAR, WORSE IN LAST 3 MONTHS. H/O SCIATICA.   HB

FINDINGS

Two views of the left hip and a single view of the pelvis were obtained.

There is mild narrowing of the left hip joint space. There are small osteophytes of the left
acetabulum and femoral head. There is subchondral cyst formation of the left acetabulum.

There is no fracture or acute abnormality left hip.

There is no acute abnormality of the pelvis.

IMPRESSION

There is mild to moderate osteoarthrosis of the left hip. There is no fracture or acute abnormality
of the left hip or pelvis.

Tech Notes:

C/O LT HIP AND BUTTOCK PAIN X 1 YEAR, WORSE IN LAST 3 MONTHS. H/O SCIATICA.
HB

## 2021-03-23 IMAGING — MR Hips^ROUTINE
6 of 8 series · 32 of 48 positions shown · non-contrast
Comparison: none

[Series 2: T1 · axial · 4.0mm · 0.68mm/px · z∈[-108,+2]mm · 6 of 22 slices shown (1 of 3)]
[im 1/22]
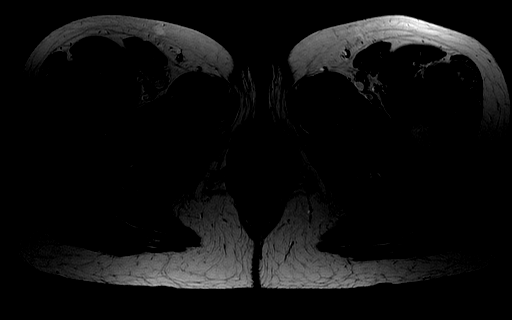
[im 5/22]
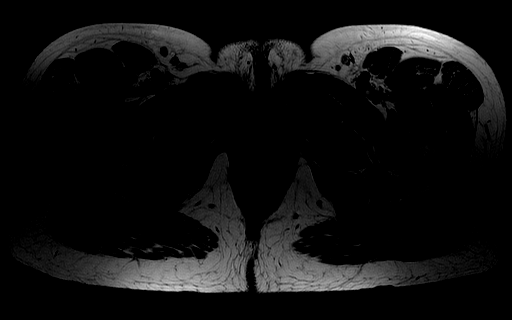
[im 9/22]
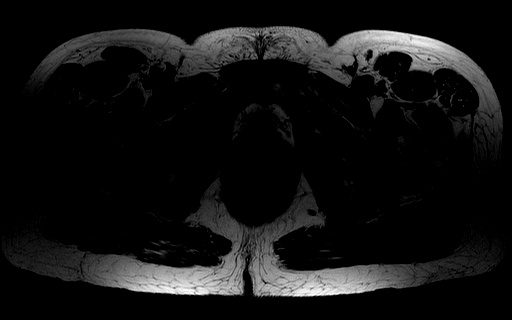
[im 13/22]
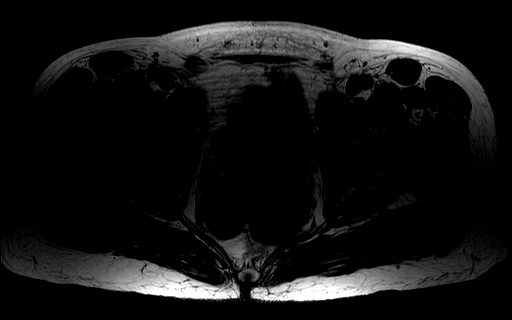
[im 17/22]
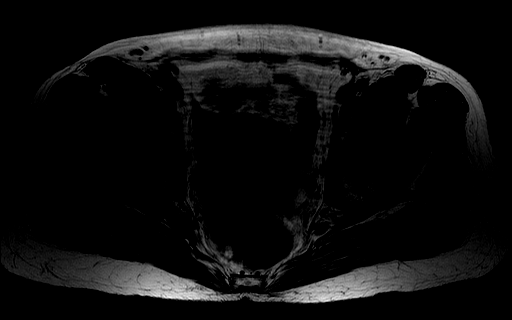
[im 22/22]
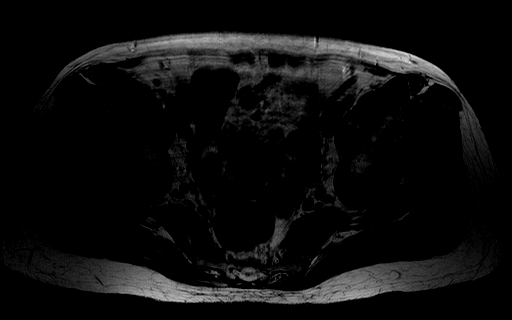

[Series 3: T1 · coronal · 4.0mm · 0.78mm/px · 6 of 24 slices shown (2 of 3)]
[im 1/24]
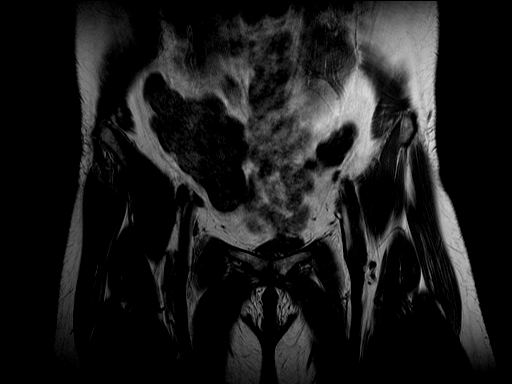
[im 5/24]
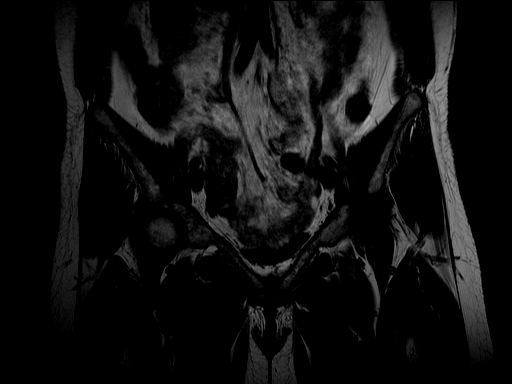
[im 10/24]
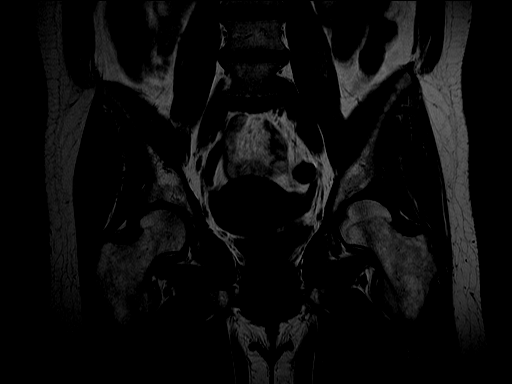
[im 14/24]
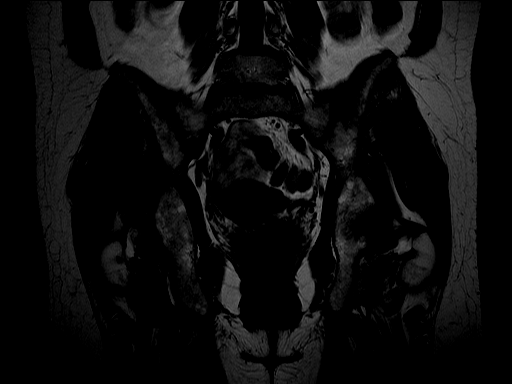
[im 19/24]
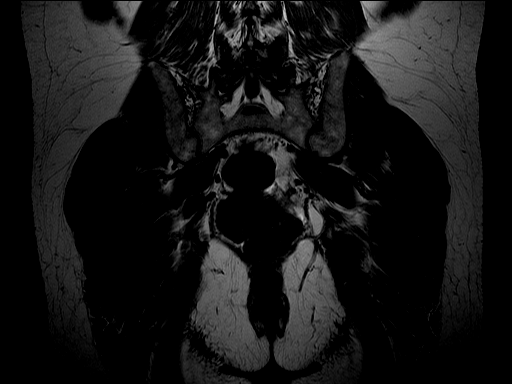
[im 24/24]
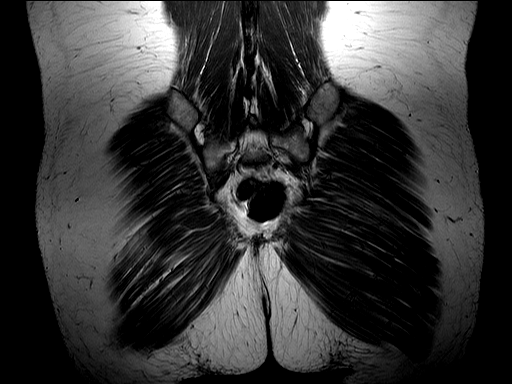

[Series 5: T2 fat-sat · axial · 4.0mm · 0.68mm/px · z∈[-106,+4]mm · 6 of 23 slices shown (1 of 2)]
[im 1/23]
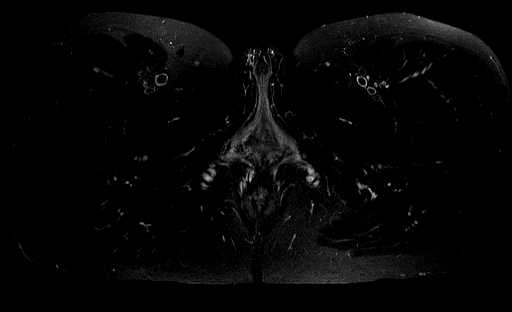
[im 5/23]
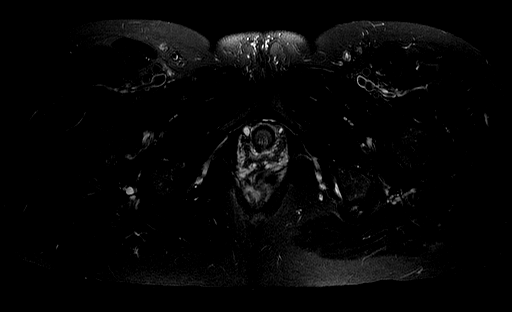
[im 9/23]
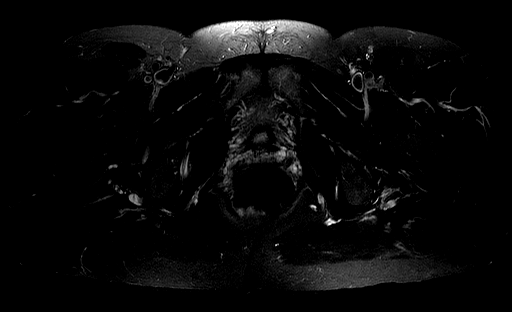
[im 14/23]
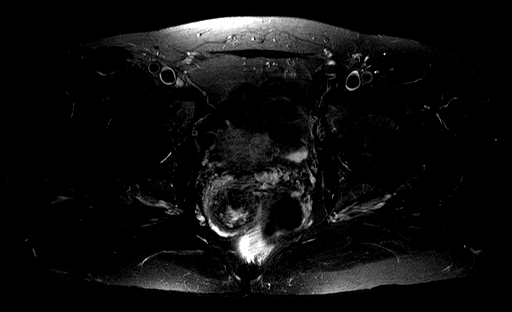
[im 18/23]
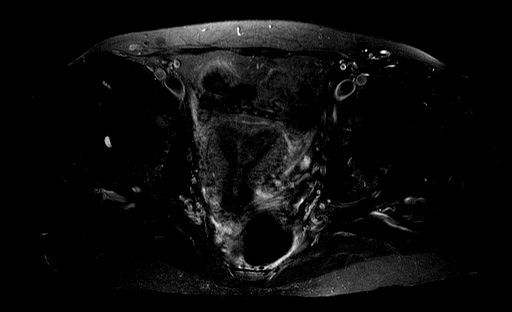
[im 23/23]
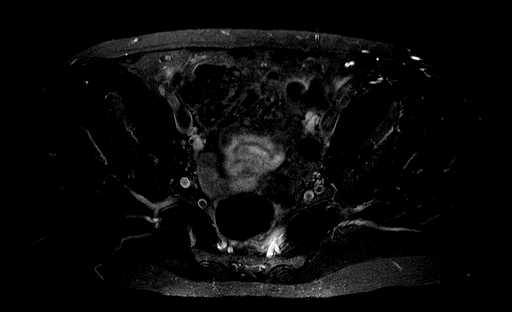

[Series 6: T1 fat-sat · axial · 6.0mm · 0.74mm/px · z∈[-114,+96]mm · 7 of 28 slices shown]
[im 1/28]
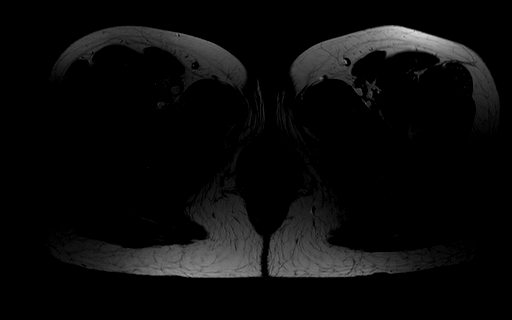
[im 5/28]
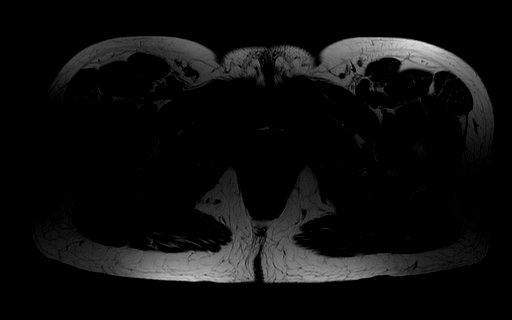
[im 10/28]
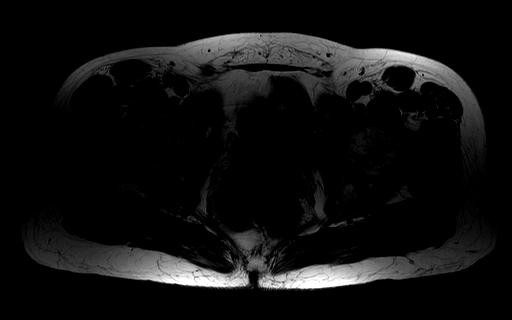
[im 14/28]
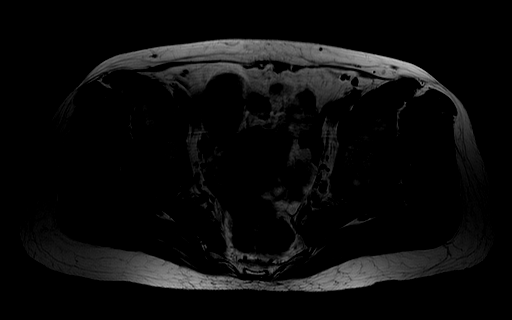
[im 19/28]
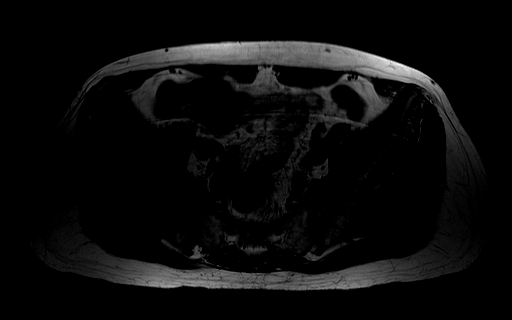
[im 23/28]
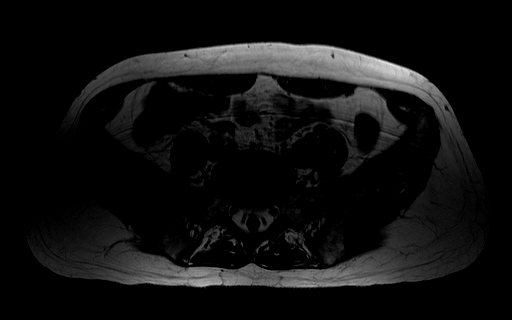
[im 28/28]
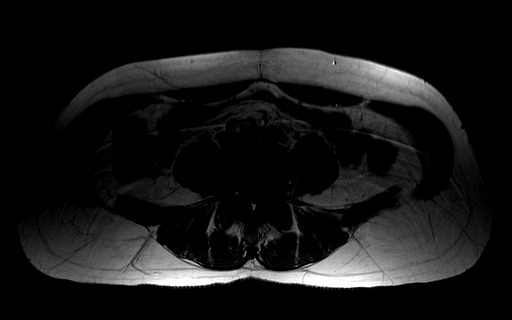

[Series 7: T2 fat-sat · sagittal · 3.5mm · 0.57mm/px · 5 of 21 slices shown (2 of 2)]
[im 1/21]
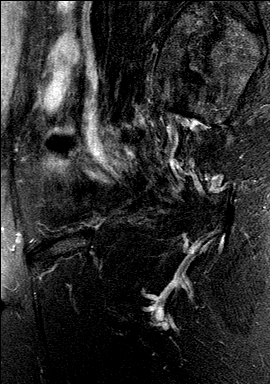
[im 6/21]
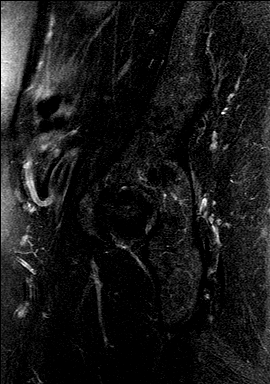
[im 11/21]
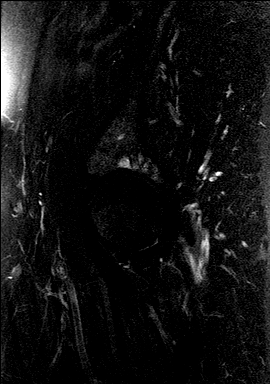
[im 16/21]
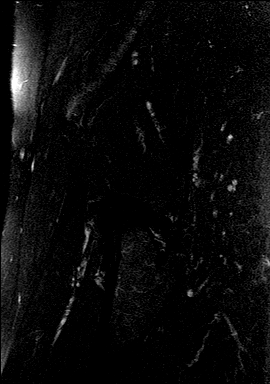
[im 21/21]
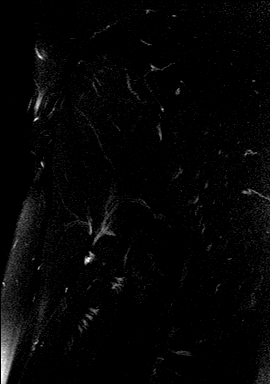

[Series 9: T1 · sagittal · 4.0mm · 0.69mm/px · 2 of 23 slices shown (3 of 3)]
[im 1/23]
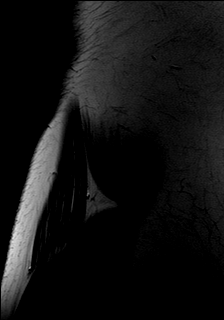
[im 5/23]
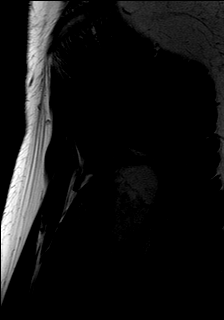

[32 of 48 positions shown; findings below may reference images not displayed]

DIAGNOSTIC STUDIES

EXAM

MRI left hip without contrast

INDICATION

left hip pain
LEFT HIP PAIN, BURNING SENSATION SINCE ALLENIUS WITH RADIATION DOWN LEFT LEG. NO IMPROVEMENT WITH SELLAHATTIN
OR HIP INJECTION.  RG

TECHNIQUE

Sagittal axial coronal images were obtained with variable T1 and T2 weighting.

COMPARISONS

None available

FINDINGS

No fractures or evidence for avascular necrosis is seen. There is mild subchondral cyst formation
within the lateral acetabulum consistent with degenerative change. Moderate loss of articular
cartilage is seen.

There is mild fluid signal at the origin of the hamstring muscles which may indicate tendinitis
and/or prior partial avulsion. Correlation with clinical symptoms is recommended.

IMPRESSION

Mild edema and fluid at the origin of the hamstring musculature. Correlation physical exam is
recommended.

No evidence for fracture or avascular necrosis.

Moderate loss of articular cartilage in subchondral cyst formation within the lateral acetabulum.

Tech Notes:

LEFT HIP PAIN, BURNING SENSATION SINCE ALLENIUS WITH RADIATION DOWN LEFT LEG. NO IMPROVEMENT WITH SELLAHATTIN
OR HIP INJECTION.  RG

## 2021-04-16 IMAGING — RF FL guided spine inject
1 series · 7 of 7 positions shown · non-contrast
Comparison: none

[Series 1: run · 4 acquisitions, 7 frames shown]
[im 1/4]
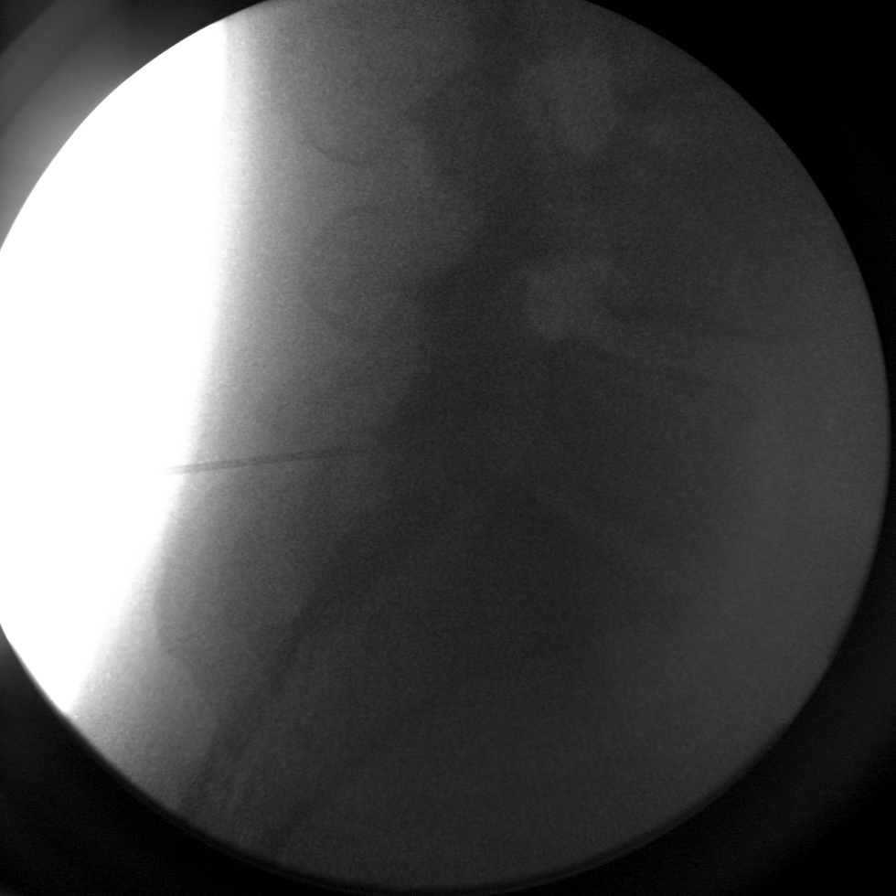
[im 2/4]
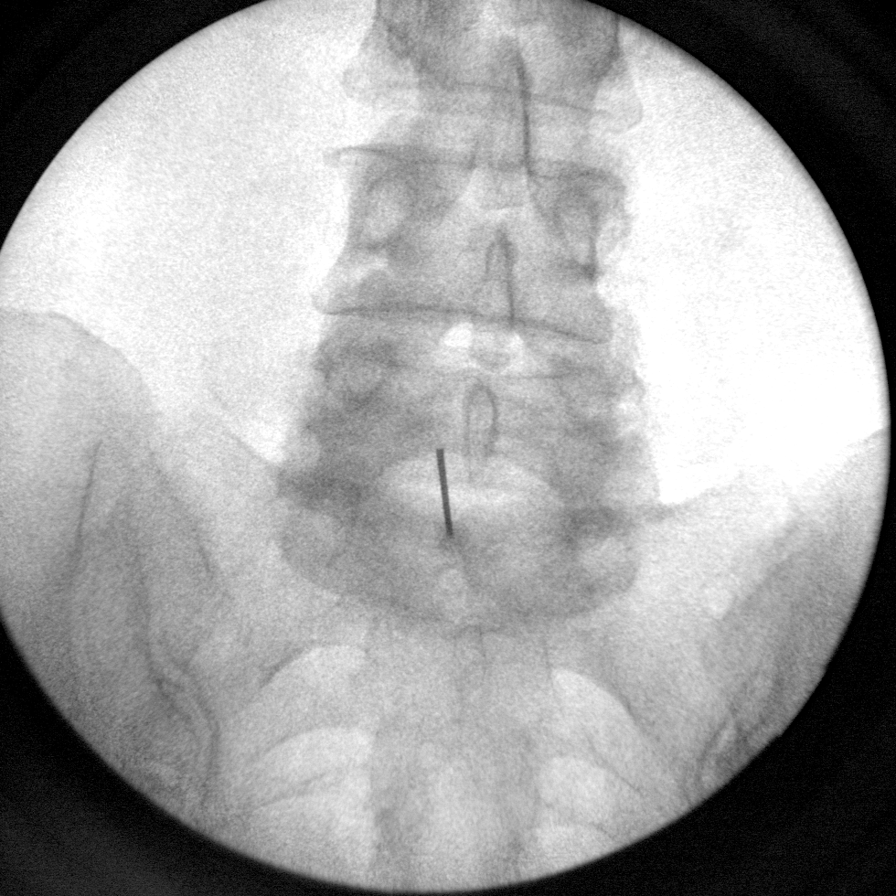
[im 3/4]
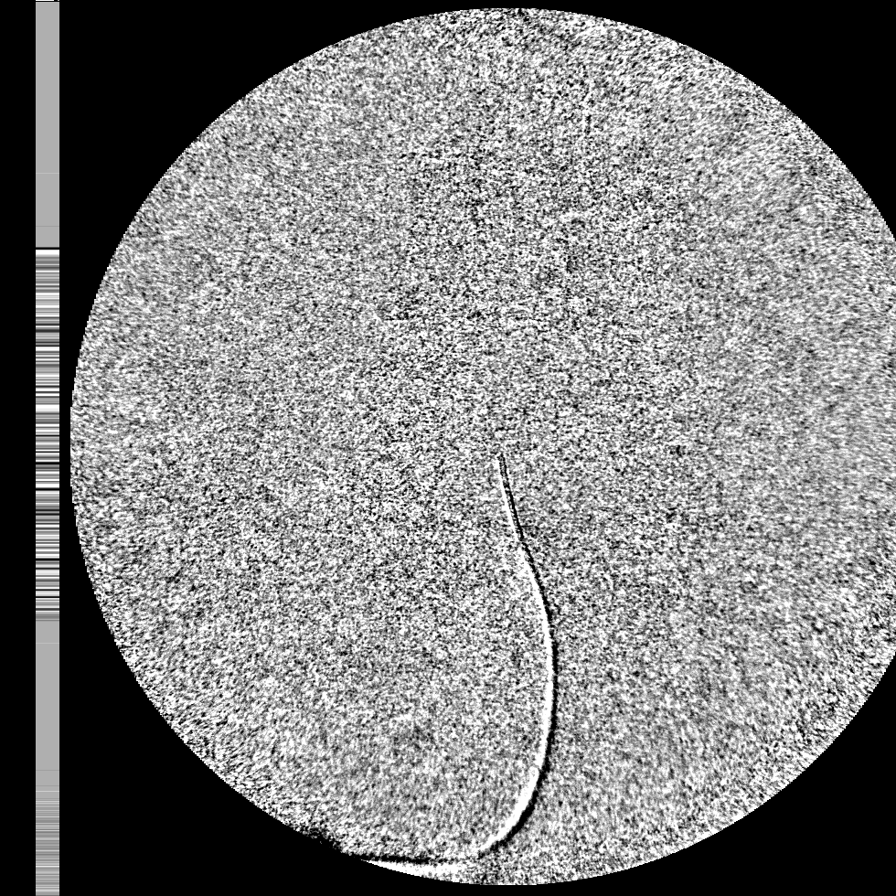
[im 3/4]
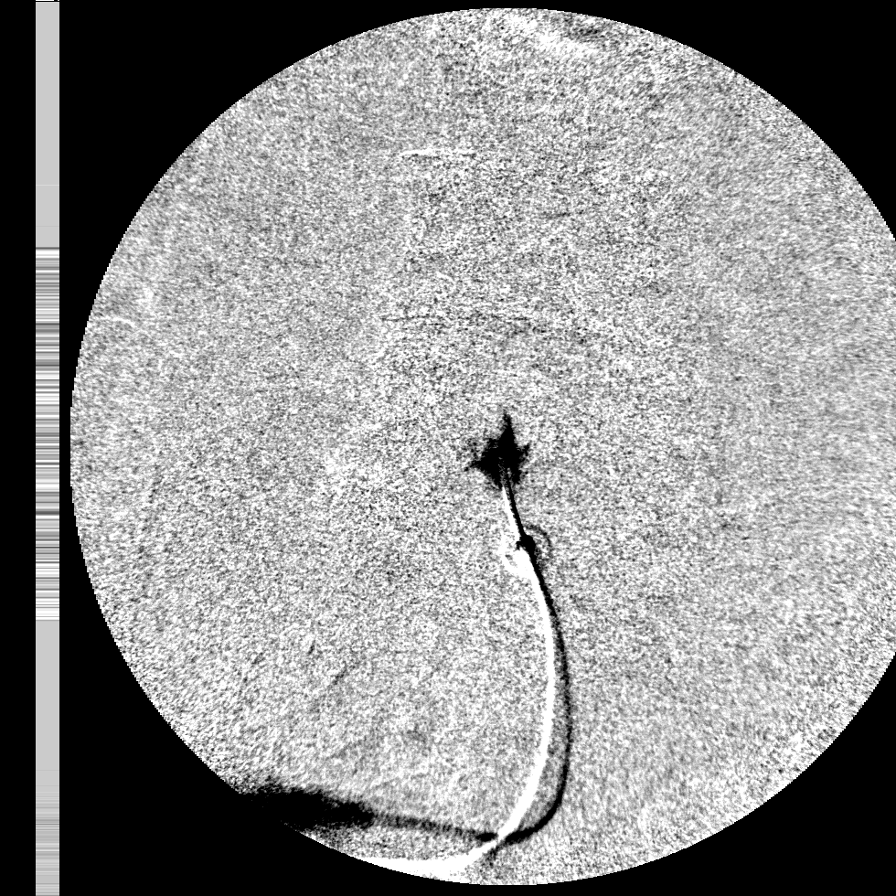
[im 3/4]
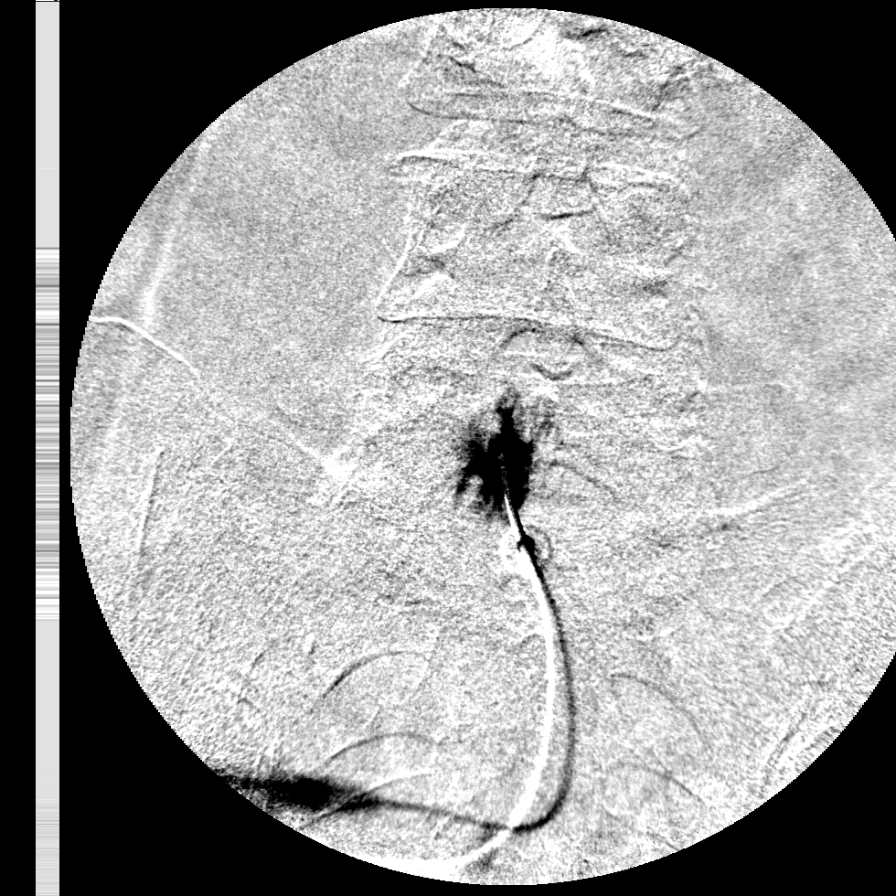
[im 3/4]
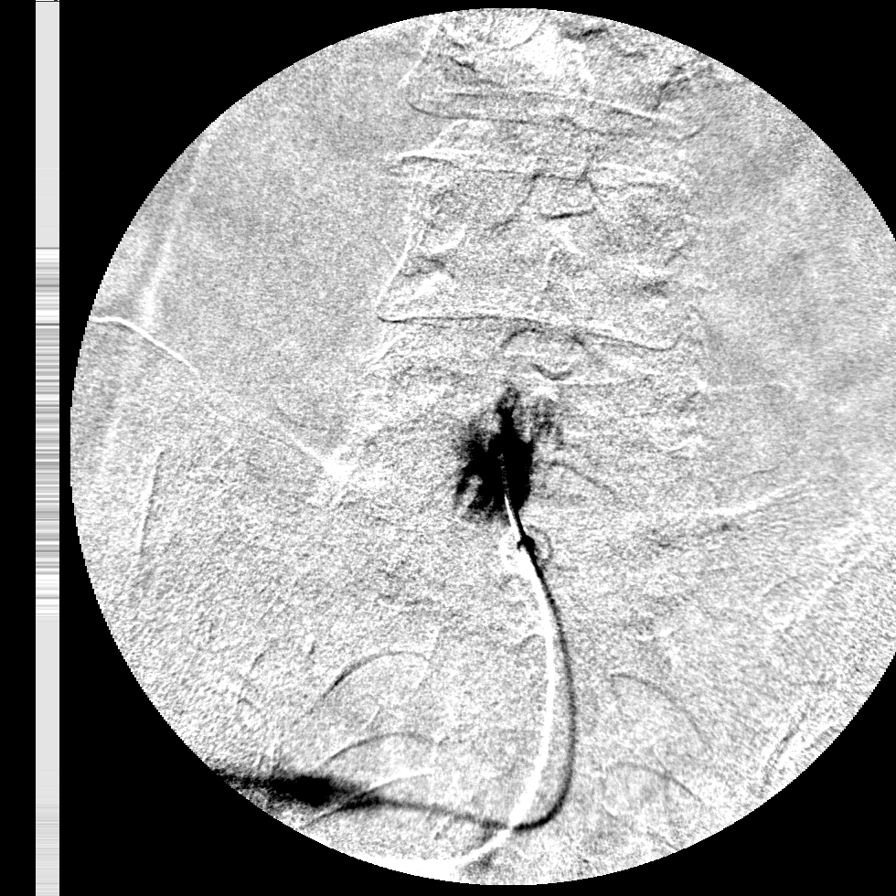
[im 4/4]
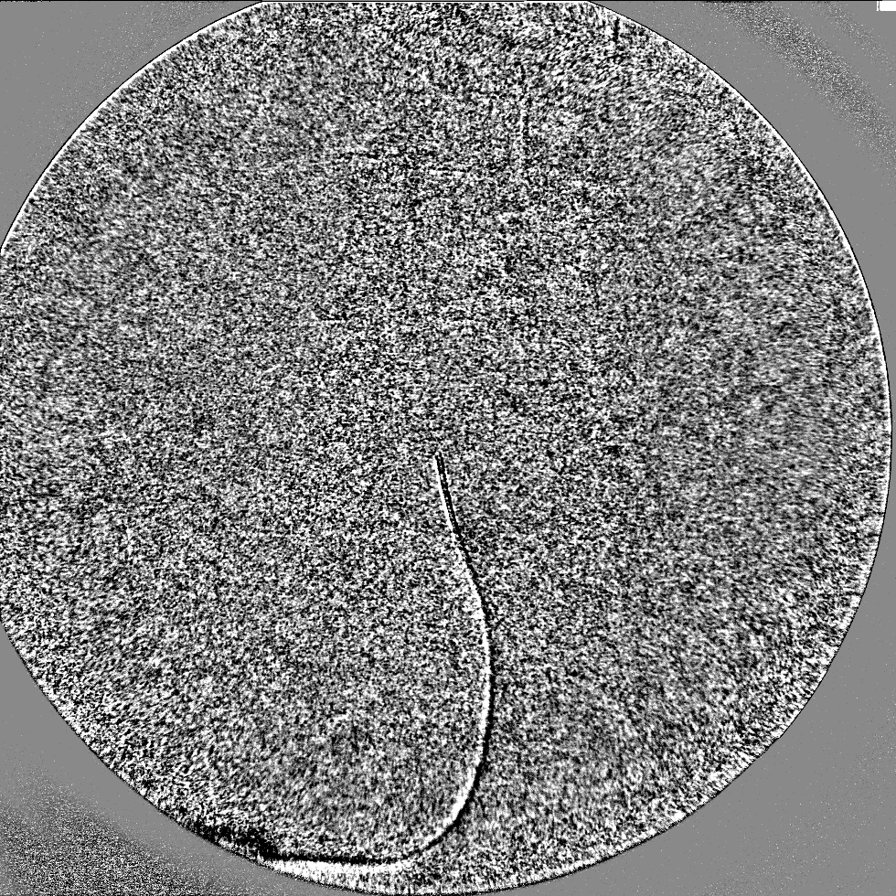

[7 of 7 positions shown; findings below may reference images not displayed]

EXAM

RADIOLOGICAL EXAMINATION, CHEST; SINGLE VIEW, FRONTAL CPT 56969

INDICATION

TIGER
UNRULY GERENA4-L5,   TIME- 11S  DOSE- E.5Sm0y  IMAGES- 4

FINDINGS

Fluoroscopy time 11 seconds. Four images. Lumbar epidural steroid injection.

IMPRESSION

Lumbar epidural steroid injection.

Tech Notes:

UNRULY GERENA4-L5,
TIME- 11S
DOSE- E.5Sm0y
IMAGES- 4
